# Patient Record
Sex: Female | Born: 1990 | Race: Black or African American | Hispanic: No | Marital: Married | Smoking: Never smoker
Health system: Southern US, Community
[De-identification: ages and names within clinical notes are randomized; demographics above are authoritative.]

## PROBLEM LIST (undated history)

## (undated) DIAGNOSIS — D649 Anemia, unspecified: Secondary | ICD-10-CM

## (undated) DIAGNOSIS — Z789 Other specified health status: Secondary | ICD-10-CM

## (undated) DIAGNOSIS — D219 Benign neoplasm of connective and other soft tissue, unspecified: Secondary | ICD-10-CM

## (undated) HISTORY — DX: Other specified health status: Z78.9

## (undated) HISTORY — PX: NO PAST SURGERIES: SHX2092

---

## 2020-01-13 ENCOUNTER — Inpatient Hospital Stay (HOSPITAL_COMMUNITY)
Admission: AD | Admit: 2020-01-13 | Discharge: 2020-01-13 | Disposition: A | Discharge status: Home / Self Care | Payer: Self-pay | Attending: Obstetrics and Gynecology | Admitting: Obstetrics and Gynecology

## 2020-01-13 ENCOUNTER — Other Ambulatory Visit: Payer: Self-pay

## 2020-01-13 DIAGNOSIS — Z3200 Encounter for pregnancy test, result unknown: Secondary | ICD-10-CM | POA: Insufficient documentation

## 2020-01-13 DIAGNOSIS — Z3201 Encounter for pregnancy test, result positive: Secondary | ICD-10-CM

## 2020-01-13 NOTE — MAU Note (Signed)
Presents for pregnancy confirmation.  States +HPT.  Denies any pain or other problems.

## 2020-01-13 NOTE — Discharge Instructions (Signed)
[place] for [employer] at [facility]    Phone: [place] for [employer] at [place]   Phone: [place] for [employer] at [place]  Phone: [place] for [employer] at [employer]  Phone: [place] for [employer] at [place]  Phone: [place] for [place] at [facility]   Phone: [facility]       Phone: [phone]  [facility]    Phone: [phone]   [facility]    Phone: [phone]  [facility]    Phone: [place]    Phone: [phone]  [place] [facility]       Phone: [phone]   [place] [facility]  Phone: [place]    Phone: [phone]  [facility]   Phone: [phone]  [facility]      Phone: [phone]  [facility]    Phone: [phone]

## 2020-02-04 ENCOUNTER — Ambulatory Visit (INDEPENDENT_AMBULATORY_CARE_PROVIDER_SITE_OTHER): Payer: Self-pay | Admitting: General Practice

## 2020-02-04 ENCOUNTER — Other Ambulatory Visit: Payer: Self-pay

## 2020-02-04 DIAGNOSIS — Z3201 Encounter for pregnancy test, result positive: Secondary | ICD-10-CM

## 2020-02-04 LAB — POCT PREGNANCY, URINE: Preg Test, Ur: POSITIVE — AB

## 2020-02-04 NOTE — Progress Notes (Signed)
Patient was assessed and managed by nursing staff during this encounter. I have reviewed the chart and agree with the documentation and plan. I have also made any necessary editorial changes.  Clarisa Fling, NP 02/04/2020 2:29 PM

## 2020-02-04 NOTE — Progress Notes (Signed)
Patient dropped off urine sample in office for UPT. UPT +.  Called patient and she reports first positive home test a month ago. This is her first pregnancy. LMP 11/08/19 [redacted]w[redacted]d EDD 08/14/20. Patient denies taking any meds/vitamins. Advised she begin taking PNV & that someone from our front office would contact her regarding scheduling her first prenatal appt. Patient verbalized understanding. I had to explain or rephrase myself multiple times to patient during phone call and it appears she doesn't completely understand English- may need interpreter at future appts.  Koren Bound RN BSN 02/04/20

## 2020-02-26 ENCOUNTER — Ambulatory Visit (INDEPENDENT_AMBULATORY_CARE_PROVIDER_SITE_OTHER): Payer: Self-pay

## 2020-02-26 ENCOUNTER — Other Ambulatory Visit (HOSPITAL_COMMUNITY)
Admission: RE | Admit: 2020-02-26 | Discharge: 2020-02-26 | Disposition: A | Discharge status: Home / Self Care | Payer: Self-pay | Source: Ambulatory Visit

## 2020-02-26 ENCOUNTER — Other Ambulatory Visit: Payer: Self-pay

## 2020-02-26 VITALS — BP 105/66 | HR 82 | Ht 64.0 in | Wt 126.3 lb

## 2020-02-26 DIAGNOSIS — Z789 Other specified health status: Secondary | ICD-10-CM

## 2020-02-26 DIAGNOSIS — Z3A15 15 weeks gestation of pregnancy: Secondary | ICD-10-CM

## 2020-02-26 DIAGNOSIS — N898 Other specified noninflammatory disorders of vagina: Secondary | ICD-10-CM

## 2020-02-26 DIAGNOSIS — Z3480 Encounter for supervision of other normal pregnancy, unspecified trimester: Secondary | ICD-10-CM | POA: Insufficient documentation

## 2020-02-26 DIAGNOSIS — Z3687 Encounter for antenatal screening for uncertain dates: Secondary | ICD-10-CM

## 2020-02-26 LAB — OB RESULTS CONSOLE GBS: GBS: POSITIVE

## 2020-02-26 MED ORDER — PREPLUS 27-1 MG PO TABS: 1.0000 | ORAL_TABLET | Freq: Every day | ORAL | 13 refills | Status: DC

## 2020-02-26 NOTE — Progress Notes (Signed)
Subjective:   Meagan Vance is a 29 y.o. G1P0 at [redacted]w[redacted]d by Unsure LMP being seen today for her first obstetrical visit.    Gynecological/Obstetrical History: Her obstetrical history is unremarkable. Patient does intend to breast feed. Pregnancy history fully reviewed. Patient reports no complaints.   Sexual Activity and Vaginal Concerns: Patient denies  bleeding or leaking, but reports a thin watery discharge. Patient reports she is uncomfortable with sexual activity, but has no pain or discomfort during the act.   Medical History/ROS: Denies significant medical history.  Denies issues with urination, constipation, diarrhea, or n/v.   Social History: Patient denies current usage of tobacco, alcohol, or drugs.  Patient reports the FOB is Adjune who is involved and supportive.  Patient reports that she lives with husband and roommate (a friend).  Patient endorses safety at home.  Patient denies DV/A. 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   HISTORY: OB History  Gravida Para Term Preterm AB Living  1 0 0 0 0 0  SAB TAB Ectopic Multiple Live Births  0 0 0 0 0    # Outcome Date GA Lbr Len/2nd Weight Sex Delivery Anes PTL Lv  1 Current             Last pap smear was deferred d/t self-pay status.   Past Medical History:  Diagnosis Date  . Medical history non-contributory    Past Surgical History:  Procedure Laterality Date  . NO PAST SURGERIES     Family History  Problem Relation Age of Onset  . Hypertension Maternal Uncle    Social History   Tobacco Use  . Smoking status: Never Smoker  . Smokeless tobacco: Never Used  Substance Use Topics  . Alcohol use: Yes    Comment: a little  . Drug use: Never   No Known Allergies No current outpatient medications on file prior to visit.   No current facility-administered medications on file prior to visit.    Review of Systems Pertinent items noted in HPI and remainder of comprehensive ROS otherwise  negative.  Exam   Vitals:   02/26/20 1450 02/26/20 1451  BP: 105/66   Pulse: 82   Weight: 126 lb 4.8 oz (57.3 kg)   Height:  5\' 4"  (1.626 m)   Fetal Heart Rate (bpm): 146  Physical Exam Constitutional:      Appearance: Normal appearance.  Genitourinary:     Vaginal discharge present.     No vaginal erythema or bleeding.     Cervical pinkness present.     No cervical motion tenderness, discharge, friability, erythema, bleeding, polyp or nabothian cyst.     Uterus is enlarged.     Genitourinary Comments: External genitalia appears inflamed with erythema.  Moderate amt thin gray discharge noted at introitus and between labial folds.  Vault with moderate amt thick white discharge and questionable curd-type consistency.  CV collected.  HENT:     Head: Normocephalic and atraumatic.  Eyes:     Conjunctiva/sclera: Conjunctivae normal.  Cardiovascular:     Rate and Rhythm: Normal rate and regular rhythm.     Heart sounds: Normal heart sounds.  Pulmonary:     Effort: Pulmonary effort is normal. No respiratory distress.     Breath sounds: Normal breath sounds.  Chest:     Breasts:        Right: No mass, nipple discharge, skin change or tenderness.        Left: No mass, nipple discharge,  skin change or tenderness.     Comments: CBE Completed Abdominal:     Palpations: Abdomen is soft.     Tenderness: There is no abdominal tenderness.     Comments: Gravid--Soft, NT, FH at 22cm   Lymphadenopathy:     Upper Body:     Right upper body: No axillary adenopathy.     Left upper body: No axillary adenopathy.  Neurological:     Mental Status: She is alert and oriented to person, place, and time.  Psychiatric:        Mood and Affect: Mood normal.        Thought Content: Thought content normal.        Judgment: Judgment normal.     Assessment:   29 y.o. year old G1P0 Patient Active Problem List   Diagnosis Date Noted  . Supervision of other normal pregnancy, antepartum  02/26/2020  . Language barrier 02/26/2020     Plan:  1. Supervision of other normal pregnancy, antepartum -Congratulations given and patient welcomed to practice. -Discussed usage of Babyscripts and virtual visits as additional source of managing and completing PN visits in midst of coronavirus.   *Instructed to take blood pressure and record weekly into babyscripts. *Reviewed modified prenatal visit schedule and platforms used for virtual visits.  -Anticipatory guidance for prenatal visits including labs, ultrasounds, and testing; Initial labs drawn. -Genetic Screening discussed, First trimester screen, Quad screen and NIPS: Not ordered d/t Self Pay Status. -Encouraged to complete MyChart Registration for her ability to review results, send requests, and have questions addressed.  -Discussed estimated due date of August 13, 2020, but how it may change depending on [place]. -Ultrasound discussed; fetal anatomic survey: ordered. -Initiate prenatal vitamins; Rx sent to pharmacy on file and printed script given.   -Encouraged to seek out care at office or emergency room for urgent and/or emergent concerns. -Educated on the nature of [place] with multiple MDs and other Advanced Practice Providers was explained to patient; also emphasized that residents, students are part of our team. Informed of her right to refuse care as she deems appropriate.  -No questions or concerns.   2. Language barrier -[place] speaking -Interpreter present and assists  3. Size greater than dates -Will order dating [place].   4. Vaginal Discharge -CV collected  Problem list reviewed and updated. Routine obstetric precautions reviewed.  Orders Placed This Encounter  Procedures  . Culture, OB Urine  . [place] MFM OB COMP + 14 WK    Standing Status:   Future    Standing Expiration Date:   04/27/2020    Order Specific Question:   Reason for Exam (SYMPTOM  OR DIAGNOSIS REQUIRED)     Answer:   Anatomy    Order Specific Question:   Preferred Location    Answer:   [facility]  . CBC/D/Plt+RPR+Rh+ABO+Rub Ab...  . Hemoglobin A1c    Return in about 4 weeks (around 03/25/2020) for LROB.     Maryann Conners, CNM 02/26/2020 3:07 PM

## 2020-02-26 NOTE — Patient Instructions (Signed)
AREA PEDIATRIC/FAMILY PRACTICE PHYSICIANS  [place] ([address]) . [facility] o Chambliss, MD; Eniola, MD; Hale, MD; Hensel, MD; McDiarmid, MD; McIntyer, MD; Emmerich Cryer, MD; Walden, MD o [address] [place], [place] [address] o [phone] o Mon-Fri 8:30-12:30, 1:30-5:00 o Providers come to see babies at [facility] o Accepting Medicaid . [facility] o Limited providers who accept newborns: Koirala, MD; Morrow, MD; Wolters, MD o [address], [place], [place] [address] o [phone] o Mon-Fri 8:00-5:30 o Babies seen by providers at [facility] o Does NOT accept Medicaid o Please call early in hospitalization for appointment (limited availability)  . [facility] o Mulberry, MD o [address] [place], [place] [address] o [phone] o Mon, Tue, Thur, Fri 8:30-5:00, Wed 10:00-7:00 (closed 1-2pm) o Babies seen by [facility] providers o Accepting Medicaid . [facility] o Rubin, MD o [address], [place], [place] [address] o [phone] o Mon-Fri 8:30-5:00, Sat 8:30-12:00 o Provider comes to see babies at [facility] o Accepting Medicaid o Must have been referred from current patients or contacted office prior to delivery . [facility] ([facility]) o Brown, MD; Chandler, MD; Ettefagh, MD; Grant, MD; Lester, MD; McCormick, MD; McQueen, MD; Prose, MD; Simha, MD; Stanley, MD; Stryffeler, NP; Tebben, NP o [address], [place], [place] [address] o [phone] o Mon, Tue, Thur, Fri 8:30-5:30, Wed 9:30-5:30, Sat 8:30-12:30 o Babies seen by [facility] providers o Accepting Medicaid o Only accepting infants of first-time parents or siblings of current patients o Hospital discharge coordinator will make follow-up appointment . Jack Amos o [address],  [place], [place]  [address] o [phone]   Fax - [phone] . [facility] o [address], [place], [place]  [address] o Phone - [phone]   Fax - [phone] . Shilpa Gosrani o [address], [place], [place]  [address] o [phone]  [place] ([address]) . [facility] o Bates, MD; [place], MD; Cooper, Cox, MD; MD; Davis, MD; Dovico, MD; Ettefaugh, MD; Little, MD; Lowe, MD; Keiffer, MD; Melvin, MD; Sumner, MD; Williams, MD o [address], [place], [place] [address] o [phone] o Mon-Fri 8:30-5:00 (extended evenings Mon-Thur as needed), Sat-Sun 10:00-1:00 o Providers come to see babies at [facility] o Accepting Medicaid for families of first-time babies and families with all children in the household age 3 and under. Must register with office prior to making appointment (M-F only). . [facility] o Henson, NP; Knapp, MD; Lalonde, MD; Tysinger, PA o [address] [place], [place] [address] o [phone] o Mon-Fri 8:00-5:00 o Babies seen by providers at [facility] o Does NOT accept Medicaid/Commercial Insurance Only . [facility] [place] ([facility]  o Artis, MD; [place], MD; Bratton, MD; Coccaro, MD; Lockett Gardner, MD; Kramer, MD; Marshall, MD; Netherton, MD; Poleto, MD; Skinner, MD o [address] [place], [place] [address] o [phone] o Mon-Fri 8:30-5:30, Sat (Oct.-Mar.) 9:00-1:00 o Babies seen by providers at [facility] o Accepting Medicaid  [place] ([address]) . [facility] o Reid, MD; Warner, MD o [address], [place], [place] [address] o [phone] o Mon-Fri 8:30-5:00, Sat 8:30-12:00 o Providers come to see babies at [facility] o Does NOT accept Medicaid . [facility] o Becker, PA; Hagler, MD; Scifres, PA; Sun, MD; Swayne, MD o [address],  [place], [place] [address] o [phone] o Mon-Fri 8:00-5:00 o Babies seen by providers at [facility] o Does NOT accept Medicaid o Only accepting babies of parents who are patients o Please call early in hospitalization for appointment (limited availability) . [facility] o Clark, MD; Frye, MD; Kelleher, MD; Mack, NP; Miller, MD; O'Keller, MD; Patterson, NP; Pudlo, MD; Puzio, MD; Thomas, MD; Tucker, MD; Twiselton, MD o [address], [place], [place] [address] o [phone] o Mon-Fri 8:00-5:00, Sat 9:00-12:00 o Providers come to see babies at [facility] o Does NOT accept Medicaid  [place] ([address]) . [facility] o Limited providers accepting new patients: Brake, NP; Wharton, PA o [address], [place], [place] [address] o [phone] o Mon-Fri 8:00-5:00 o Babies seen by providers at [facility] o Does NOT accept Medicaid o Only accepting babies of parents who are patients o Please call early in hospitalization for appointment (limited availability) . [facility] o Gay, MD; Quinlan, MD o [address] [place], [place] [address] o [phone] (press 1 to schedule appointment) o Mon-Fri 8:00-5:00 o Providers come to see babies at [facility] o Does NOT accept Medicaid . [facility] o Mazer, MD o [address] [place], [place] [address] o [phone] o Mon-Fri 8:30-5:00 (lunch 12:30-1:00), extended hours by appointment only Wed 5:00-6:30 o Babies seen by [facility] providers o Accepting Medicaid . [facility] o Banks, MD; Jordan, MD; Koberlein, MD o [address], [place], [place] [address] o [phone] o Mon-Fri 8:00-5:00 o Babies seen by [facility] providers o Does NOT accept Medicaid . [facility] o Parker, MD; Hunter, MD; Wallace, DO o [address] [place], [place]  [address] o [phone] o Mon-Fri 8:00-5:00 o Babies seen by [facility] providers o Does NOT accept Medicaid . [facility] o Brandon, PA; Brecken, PA; Christy, NP; Dees, MD; DeClaire, MD; DeWeese, MD; Hansen, NP; Mills, NP; Parrish, NP; Smoot, NP; Summer, MD; Vapne, MD o [address] [place], [place] [address] o [phone] o Mon-Fri 8:30-5:00, Sat 10:00-1:00 o Providers come to see babies at [facility] o Does NOT accept Medicaid o Free prenatal information session Tuesdays at 4:45pm . [facility] o Bouska, MD; Gordon, PA; Jeffery, PA; Weber, PA o [address] [place] [address] o [phone] o Mon-Fri 7:30-5:30 o Babies seen by [facility] providers . [facility] o [address], [place], [place]  [address] o [phone]   Fax - [phone]  [place] ([address] & [address]) . [facility] o [place]ese, MD o [address] [place], [place] [address] o [phone] o Mon-Thur 8:00-6:00 o Providers come to see babies at [facility] o Accepting Medicaid . [facility] o Anderson, NP; Badger, MD; Beal, PA; Spencer, PA o [address] [place], [place] [address] o [phone] o Mon-Thur 7:30-7:30, Fri 7:30-4:30 o Babies seen by [facility] providers o Accepting Medicaid . [facility] o Agbuya, MD; Klett, NP; Romgoolam, MD o [address], [place], [place] [address] o [phone] o Mon-Fri 8:30-5:00, Sat 8:30-12:00 o Providers come to see babies at [facility] o Accepting Medicaid o Must have "Meet & Greet" appointment at office prior to delivery . [facility]  [facility] o McCord,   MD; Wallace, MD; Wood, MD o [address], [place], [place] [address] o [phone] o Mon-Wed 8:00-6:00, Thur-Fri 8:00-5:00, Sat 9:00-12:00 o Providers come to  see babies at [facility] o Does NOT accept Medicaid o Only accepting siblings of current patients . [facility]  o [address], [place], [place]  [address] o [phone]   Fax - [phone] . [facility] o [address], [place], [place]  [address] o [phone]   Fax - [phone]  [place] ([address] & [address]) . [facility] o Cirigliano, DO; Matthews, DO o [address] [place], [place] [address] o [phone] o Mon-Fri 7:00-5:00 o Babies seen by [facility] providers o Does NOT accept Medicaid . [facility] o Briscoe, MD; Howley, PA; Moreira, PA o [address], [place], [place] [address] o [phone] o Mon-Fri 8:00-5:00 o Babies seen by [facility] providers o Accepting Medicaid . [facility] o Boyd, MD; Church, PA; Jones, NP; Osborn, PA o [address], [place], [place] [address] o [phone] o Mon-Fri 8:00-5:00 o Babies seen by providers at [facility] o Accepting Medicaid  [place] ([address]) . [facility] [place] o Wendling, DO o [address] [place], [place] [address] o [phone] o Mon-Fri 8:00-5:00 o Babies seen by [facility] providers o Does NOT accept Medicaid o Limited availability, please call early in hospitalization to schedule follow-up . [facility] o Calderon, PA; Cummings, MD; Dillard, MD; Martin, PA; Olson, MD; VanDeven, PA o [address], [place], [place] [address] o [phone] o Mon-Fri 8:30-5:00, Sat 9:00-12:00 o Babies seen by providers at [facility] o Accepting Medicaid o Please register online then schedule online or call office o [email or web address] . [facility] o Hunter, NP; Kumar, MD; Martin Rogers, PA o [address], [place], [place] [address] o [phone] o Mon-Fri 8:00-5:00 o Babies seen by providers at [facility] o Accepting Medicaid . [facility] ([facility] o Acampo, MD; Kristi Fleenor, NP; West, MD o [address], [place], [place] [address] o [phone] o Mon-Fri 8:00-5:30, Sat&Sun by appointment (phones open at 8:30) o Babies seen by [facility] providers o Accepting Medicaid o Must be a first-time baby or sibling of current patient . [facility] [place]  o [address], [place], [place]  [address] o [phone]   Fax - [phone]  [place] ([address]) . [facility] o Brown, PA; Cowen, PA; Rice, MD; Helton, PA; Spry, MD o [address] [place], [place] [address] o [phone] o Mon-Thur 8:00-7:00, Fri 8:00-5:00, Sat 8:00-12:00, Sun 9:00-12:00 o Babies seen by [facility] providers o Accepting Medicaid . [facility] [place] o Coe-Goins, MD; Marshall, MD; Pierre-Louis, MD o [address] [place] [address], [place],  [address] o [phone] o Mon-Thur 8:00-5:00 o Babies seen by providers at [facility] o Accepting Medicaid . [facility] o Bratton, MD; Coe-Goins, MD; Hayes, MD; Lewis, MD; List, MD; Lott, MD; Marshall, MD; Moran, MD; O'Ahuva Poynor, MD; Pierre-Louis, MD; Pitonzo, MD; Scholer, MD; Spangle, MD o [address] [place],    [address] o [phone] o Mon-Fri 8:00-5:30, Sat (Oct.-Mar.) 9:00-1:00 o Babies seen by providers at [facility] o Accepting Medicaid o Must fill out new patient packet, available online at [email or web address] . [facility] ([facility] o Friddle, NP; Harris, NP; Kelly, NP; Logan, MD;  Melvin, PA; Poth, MD; Ramadoss, MD; Stanton, NP o [address], [place], [place] [address] o [phone] o Mon-Thur 8:00-5:30, Fri 8:00-5:00 o Babies seen by providers at [facility] o Accepting Medicaid  [place] ([address]) . [facility] o Dixon, PA; Catahoula, MD; Pickard, MD; Tapia, PA o [address], [place], [place] [address] o [phone] o Mon-Fri 8:00-5:00 o Babies seen by providers at [facility] o Accepting Medicaid   [place] ([address]) . [facility] [place] o Masneri, DO; Meyers, MD; Nelson, PA o [address], [place], [place] [address] o [phone] o Mon-Fri 8:00-5:00 o Babies seen by providers at [facility] o Does NOT accept Medicaid o Limited appointment availability, please call early in hospitalization  . [facility] [place] o Kunedd, DO; McGowen, MD o [address], [place], [place] [address] o [phone] o Mon-Fri 8:00-5:00 o Babies seen by [facility] providers o Does NOT accept Medicaid . [facility] [place] o Cameron, MD; MacDonald, MD; Michaels, PA; Nayak, MD o [address] [place] [address], [place], [place] [address] o [phone] o Mon-Fri 8:00-5:00 o After hours clinic ([address] [place], [place] [address]) [phone] Mon-Fri 5:00-8:00, Sat 12:00-6:00, Sun 10:00-4:00 o Babies seen by [facility] providers o Accepting Medicaid . [facility] [place] o [address], [place], [place]  [address] o [phone]   Fax - [phone]  [place] ([address]) . [facility] [place] o Andy, MD o [address], [place], [place] [address] o [phone] o Mon-Fri 8:00-5:00 o Babies seen by [facility] providers o Does NOT accept Medicaid . [facility] [place] ([facility] [place]) o Eksir, MD o [address], [place], [place]  [address] o [phone] o Mon-Thur 8:00-7:00, Fri 8:00-5:00, Sat 8:00-12:00 o Babies seen by providers at [facility] o Accepting Medicaid - but does not have vaccinations in office (must be received elsewhere) o Limited availability, please call early in hospitalization  [place] ([address]) . [facility]  o Charlene Flemming, MD o [address], [place]  [address] o [phone]  Fax [phone]   

## 2020-02-27 LAB — CBC/D/PLT+RPR+RH+ABO+RUB AB...
Basophils Absolute: 0 10*3/uL (ref 0.0–0.2)
Basos: 0 %
EOS (ABSOLUTE): 0 10*3/uL (ref 0.0–0.4)
Eos: 1 %
HCV Ab: <0.1 s/co ratio (ref 0.0–0.9)
HIV Screen 4th Generation wRfx: NONREACTIVE
Hematocrit: 30 % — ABNORMAL LOW (ref 34.0–46.6)
Hemoglobin: 9.7 g/dL — ABNORMAL LOW (ref 11.1–15.9)
Hepatitis B Surface Ag: NEGATIVE
Immature Grans (Abs): 0 10*3/uL (ref 0.0–0.1)
Immature Granulocytes: 0 %
Lymphocytes Absolute: 2 10*3/uL (ref 0.7–3.1)
Lymphs: 28 %
MCH: 25.9 pg — ABNORMAL LOW (ref 26.6–33.0)
MCHC: 32.3 g/dL (ref 31.5–35.7)
MCV: 80 fL (ref 79–97)
Monocytes Absolute: 0.5 10*3/uL (ref 0.1–0.9)
Monocytes: 7 %
Neutrophils Absolute: 4.6 10*3/uL (ref 1.4–7.0)
Neutrophils: 64 %
Platelets: 365 10*3/uL (ref 150–450)
RBC: 3.75 x10E6/uL — ABNORMAL LOW (ref 3.77–5.28)
RDW: 13.8 % (ref 11.7–15.4)
RPR Ser Ql: NONREACTIVE
Rh Factor: POSITIVE
Rubella Antibodies, IGG: >33 index (ref >0.99)
WBC: 7.2 10*3/uL (ref 3.4–10.8)

## 2020-02-27 LAB — CERVICOVAGINAL ANCILLARY ONLY
Bacterial Vaginitis (gardnerella): NEGATIVE
Candida Glabrata: NEGATIVE
Candida Vaginitis: POSITIVE — AB
Chlamydia: NEGATIVE
Comment: NEGATIVE
Comment: NEGATIVE
Comment: NEGATIVE
Comment: NEGATIVE
Comment: NEGATIVE
Comment: NORMAL
Neisseria Gonorrhea: NEGATIVE
Trichomonas: NEGATIVE

## 2020-02-27 LAB — AB SCR+ANTIBODY ID: Antibody Screen: POSITIVE — AB

## 2020-02-27 LAB — HEMOGLOBIN A1C
Est. average glucose Bld gHb Est-mCnc: 111 mg/dL
Hgb A1c MFr Bld: 5.5 % (ref 4.8–5.6)

## 2020-02-27 LAB — HCV INTERPRETATION

## 2020-02-27 MED ORDER — TERCONAZOLE 0.4 % VA CREA
1.0000 | TOPICAL_CREAM | Freq: Every day | VAGINAL | 0 refills | Status: DC
Start: 2020-02-27 — End: 2020-03-30

## 2020-02-27 MED ORDER — FERROUS SULFATE 325 (65 FE) MG PO TABS: 325.0000 mg | ORAL_TABLET | ORAL | 1 refills | Status: DC

## 2020-02-27 NOTE — Addendum Note (Signed)
Addended by: [name] L on: 02/27/2020 09:29 PM   Modules accepted: Orders

## 2020-03-01 ENCOUNTER — Telehealth (INDEPENDENT_AMBULATORY_CARE_PROVIDER_SITE_OTHER): Payer: Self-pay

## 2020-03-01 ENCOUNTER — Other Ambulatory Visit: Payer: Self-pay | Admitting: Nurse Practitioner

## 2020-03-01 ENCOUNTER — Encounter: Payer: Self-pay | Admitting: Nurse Practitioner

## 2020-03-01 DIAGNOSIS — O2340 Unspecified infection of urinary tract in pregnancy, unspecified trimester: Secondary | ICD-10-CM | POA: Insufficient documentation

## 2020-03-01 DIAGNOSIS — Z3480 Encounter for supervision of other normal pregnancy, unspecified trimester: Secondary | ICD-10-CM

## 2020-03-01 DIAGNOSIS — B951 Streptococcus, group B, as the cause of diseases classified elsewhere: Secondary | ICD-10-CM | POA: Insufficient documentation

## 2020-03-01 LAB — CULTURE, OB URINE

## 2020-03-01 LAB — URINE CULTURE, OB REFLEX

## 2020-03-01 MED ORDER — AMOXICILLIN 500 MG PO TABS
500.0000 mg | ORAL_TABLET | Freq: Three times a day (TID) | ORAL | 0 refills | Status: AC
Start: 2020-03-01 — End: 2020-03-08

## 2020-03-01 NOTE — Telephone Encounter (Addendum)
-----   Message from [name], [place] sent at 02/27/2020  9:29 PM EDT ----- Please call patient and inform of yeast infection and low iron. Patient is to take iron supplement every other day. I have sent prescriptions to patient's pharmacy, but patient may require paper scripts to find more inexpensive options. Thanks, Berle Mull, Rona Ravens, NP  P [facility] GBS UTI - medication ordered. Please call client and notify her and advise to pick up medication.    Called pt with [place] # [phone] and L/M that I am calling with results if she could please give the office a call back.    Mel Almond, RN  03/01/20

## 2020-03-03 ENCOUNTER — Ambulatory Visit
Admission: RE | Admit: 2020-03-03 | Discharge: 2020-03-03 | Disposition: A | Discharge status: Home / Self Care | Payer: Self-pay | Source: Ambulatory Visit

## 2020-03-03 ENCOUNTER — Other Ambulatory Visit: Payer: Self-pay

## 2020-03-03 ENCOUNTER — Ambulatory Visit (HOSPITAL_BASED_OUTPATIENT_CLINIC_OR_DEPARTMENT_OTHER): Payer: Self-pay

## 2020-03-03 DIAGNOSIS — D259 Leiomyoma of uterus, unspecified: Secondary | ICD-10-CM

## 2020-03-03 DIAGNOSIS — Z3480 Encounter for supervision of other normal pregnancy, unspecified trimester: Secondary | ICD-10-CM | POA: Insufficient documentation

## 2020-03-03 DIAGNOSIS — Z363 Encounter for antenatal screening for malformations: Secondary | ICD-10-CM

## 2020-03-03 DIAGNOSIS — O26842 Uterine size-date discrepancy, second trimester: Secondary | ICD-10-CM

## 2020-03-03 DIAGNOSIS — Z3A16 16 weeks gestation of pregnancy: Secondary | ICD-10-CM

## 2020-03-03 DIAGNOSIS — O3412 Maternal care for benign tumor of corpus uteri, second trimester: Secondary | ICD-10-CM

## 2020-03-04 ENCOUNTER — Encounter: Payer: Self-pay | Admitting: Nurse Practitioner

## 2020-03-04 ENCOUNTER — Other Ambulatory Visit: Payer: Self-pay

## 2020-03-04 DIAGNOSIS — D219 Benign neoplasm of connective and other soft tissue, unspecified: Secondary | ICD-10-CM | POA: Insufficient documentation

## 2020-03-04 DIAGNOSIS — D259 Leiomyoma of uterus, unspecified: Secondary | ICD-10-CM | POA: Insufficient documentation

## 2020-03-04 NOTE — Telephone Encounter (Signed)
Called patient with assistance of [place] Telephone interpreter, Vicente Males # [phone].   Informed patient that she has a UTI and needs to take Antibiotics and need to start ATB ASAP. Informed her that she has a yeast infection and Terazole has been prescribed to take at night for 7 nights. Reviewed can take along with ATB.   Informed her she has low iron and that Iron pills have been prescribed to take every other day with [facility].   Patient voiced understanding to all the above with no further questions or concerns at this time.

## 2020-03-23 ENCOUNTER — Other Ambulatory Visit: Payer: Self-pay

## 2020-03-23 ENCOUNTER — Other Ambulatory Visit: Payer: Self-pay | Admitting: *Deleted

## 2020-03-23 ENCOUNTER — Ambulatory Visit: Payer: Medicaid Other

## 2020-03-23 DIAGNOSIS — O35EXX0 Maternal care for other (suspected) fetal abnormality and damage, fetal genitourinary anomalies, not applicable or unspecified: Secondary | ICD-10-CM

## 2020-03-23 DIAGNOSIS — Z363 Encounter for antenatal screening for malformations: Secondary | ICD-10-CM

## 2020-03-23 DIAGNOSIS — Z3480 Encounter for supervision of other normal pregnancy, unspecified trimester: Secondary | ICD-10-CM | POA: Insufficient documentation

## 2020-03-23 DIAGNOSIS — O359XX0 Maternal care for (suspected) fetal abnormality and damage, unspecified, not applicable or unspecified: Secondary | ICD-10-CM

## 2020-03-23 DIAGNOSIS — O3412 Maternal care for benign tumor of corpus uteri, second trimester: Secondary | ICD-10-CM

## 2020-03-23 DIAGNOSIS — Z3A19 19 weeks gestation of pregnancy: Secondary | ICD-10-CM

## 2020-03-23 DIAGNOSIS — O26842 Uterine size-date discrepancy, second trimester: Secondary | ICD-10-CM

## 2020-03-23 DIAGNOSIS — D259 Leiomyoma of uterus, unspecified: Secondary | ICD-10-CM

## 2020-03-30 ENCOUNTER — Encounter: Payer: Self-pay | Admitting: General Practice

## 2020-03-30 ENCOUNTER — Ambulatory Visit (INDEPENDENT_AMBULATORY_CARE_PROVIDER_SITE_OTHER): Payer: Medicaid Other | Admitting: Certified Nurse Midwife

## 2020-03-30 ENCOUNTER — Other Ambulatory Visit: Payer: Self-pay

## 2020-03-30 ENCOUNTER — Encounter: Payer: Self-pay | Admitting: Certified Nurse Midwife

## 2020-03-30 VITALS — BP 105/67 | HR 93 | Wt 130.3 lb

## 2020-03-30 DIAGNOSIS — Z3480 Encounter for supervision of other normal pregnancy, unspecified trimester: Secondary | ICD-10-CM

## 2020-03-30 DIAGNOSIS — B951 Streptococcus, group B, as the cause of diseases classified elsewhere: Secondary | ICD-10-CM

## 2020-03-30 DIAGNOSIS — Z789 Other specified health status: Secondary | ICD-10-CM

## 2020-03-30 DIAGNOSIS — O2342 Unspecified infection of urinary tract in pregnancy, second trimester: Secondary | ICD-10-CM

## 2020-03-30 LAB — POCT URINALYSIS DIP (DEVICE)
Bilirubin Urine: NEGATIVE
Glucose, UA: NEGATIVE mg/dL
Hgb urine dipstick: NEGATIVE
Ketones, ur: NEGATIVE mg/dL
Leukocytes,Ua: NEGATIVE
Nitrite: NEGATIVE
Protein, ur: NEGATIVE mg/dL
Specific Gravity, Urine: 1.01 (ref 1.005–1.030)
Urobilinogen, UA: 0.2 mg/dL (ref 0.0–1.0)
pH: 6 (ref 5.0–8.0)

## 2020-03-30 NOTE — Progress Notes (Signed)
   PRENATAL VISIT NOTE  Subjective:  Meagan Vance is a 29 y.o. G1P0 at [redacted]w[redacted]d being seen today for ongoing prenatal care.  She is currently monitored for the following issues for this low-risk pregnancy and has Supervision of other normal pregnancy, antepartum; Language barrier; GBS (group B streptococcus) UTI complicating pregnancy; and Uterine fibroids affecting pregnancy, antepartum on their problem list.  Patient reports no complaints.  Contractions: Not present. Vag. Bleeding: None.  Movement: Absent. Denies leaking of fluid.   The following portions of the patient's history were reviewed and updated as appropriate: allergies, current medications, past family history, past medical history, past social history, past surgical history and problem list.   Objective:   Vitals:   03/30/20 1017  BP: 105/67  Pulse: 93  Weight: 130 lb 4.8 oz (59.1 kg)    Fetal Status: Fetal Heart Rate (bpm): 140   Movement: Absent     General:  Alert, oriented and cooperative. Patient is in no acute distress.  Skin: Skin is warm and dry. No rash noted.   Cardiovascular: Normal heart rate noted  Respiratory: Normal respiratory effort, no problems with respiration noted  Abdomen: Soft, gravid, appropriate for gestational age.  Pain/Pressure: Present     Pelvic: Cervical exam deferred        Extremities: Normal range of motion.  Edema: None  Mental Status: Normal mood and affect. Normal behavior. Normal judgment and thought content.   Assessment and Plan:  Pregnancy: G1P0 at [redacted]w[redacted]d 1. Supervision of other normal pregnancy, antepartum - patient doing well, no complaints - Patient has medicaid and would like to do genetic screening at this time  - Routine prenatal care - Anticipatory guidance on upcoming appointments  - AFP, Serum, Open Spina Bifida - Genetic Screening  2. Group B Streptococcus urinary tract infection affecting pregnancy in second trimester - TOC completed today  - Patient will  need tx during labor  - Culture, OB Urine  3. Language barrier - [place] interpreter at bedside   Preterm labor symptoms and general obstetric precautions including but not limited to vaginal bleeding, contractions, leaking of fluid and fetal movement were reviewed in detail with the patient. Please refer to After Visit Summary for other counseling recommendations.   Return in about 4 weeks (around 04/27/2020) for ROB-in person .  Future Appointments  Date Time Provider [place]  05/03/2020  1:15 PM Herby Abraham [facility]  05/18/2020  1:00 PM [facility] NURSE [facility]  05/18/2020  1:15 PM [facility] US2 [facility] [place]    Lajean Manes, CNM

## 2020-03-30 NOTE — Patient Instructions (Signed)

## 2020-04-01 LAB — AFP, SERUM, OPEN SPINA BIFIDA
AFP MoM: 1.15
AFP Value: 77.2 ng/mL
Gest. Age on Collection Date: 20.4 weeks
Maternal Age At EDD: 27.7 yr
OSBR Risk 1 IN: 7603
Test Results:: NEGATIVE
Weight: 130 [lb_av]

## 2020-04-01 LAB — URINE CULTURE, OB REFLEX: Organism ID, Bacteria: NO GROWTH

## 2020-04-01 LAB — CULTURE, OB URINE

## 2020-04-12 ENCOUNTER — Encounter: Payer: Self-pay | Admitting: *Deleted

## 2020-04-22 ENCOUNTER — Telehealth (INDEPENDENT_AMBULATORY_CARE_PROVIDER_SITE_OTHER): Payer: Medicaid Other | Admitting: Lactation Services

## 2020-04-22 DIAGNOSIS — Z3480 Encounter for supervision of other normal pregnancy, unspecified trimester: Secondary | ICD-10-CM

## 2020-04-22 NOTE — Telephone Encounter (Signed)
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  Informed patient that she is a silent carrier for Alpha Thalassemia and carrier for Tay-Sachs Disease.   Recommended that patient call [facility] at [phone] to schedule a Telephone Genetic Counseling Session. Discussed it will be recommended that FOB also be tested for the same genes and that Johnsie Cancel will discuss how to ger FOB tested.   Patient with no questions or concerns.

## 2020-05-03 ENCOUNTER — Encounter: Payer: Self-pay | Admitting: Certified Nurse Midwife

## 2020-05-03 ENCOUNTER — Ambulatory Visit (INDEPENDENT_AMBULATORY_CARE_PROVIDER_SITE_OTHER): Payer: Medicaid Other | Admitting: Certified Nurse Midwife

## 2020-05-03 ENCOUNTER — Other Ambulatory Visit: Payer: Self-pay

## 2020-05-03 VITALS — BP 112/60 | HR 112 | Wt 136.2 lb

## 2020-05-03 DIAGNOSIS — Z3A25 25 weeks gestation of pregnancy: Secondary | ICD-10-CM

## 2020-05-03 DIAGNOSIS — Z3480 Encounter for supervision of other normal pregnancy, unspecified trimester: Secondary | ICD-10-CM

## 2020-05-03 DIAGNOSIS — R8271 Bacteriuria: Secondary | ICD-10-CM

## 2020-05-03 DIAGNOSIS — Z789 Other specified health status: Secondary | ICD-10-CM

## 2020-05-03 NOTE — Patient Instructions (Signed)
Glucose Tolerance Test During Pregnancy Why am I having this test? The glucose tolerance test (GTT) is done to check how your body processes sugar (glucose). This is one of several tests used to diagnose diabetes that develops during pregnancy (gestational diabetes mellitus). Gestational diabetes is a temporary form of diabetes that some women develop during pregnancy. It usually occurs during the second trimester of pregnancy and goes away after delivery. Testing (screening) for gestational diabetes usually occurs between 24 and 28 weeks of pregnancy. You may have the GTT test after having a 1-hour glucose screening test if the results from that test indicate that you may have gestational diabetes. You may also have this test if:  You have a history of gestational diabetes.  You have a history of giving birth to very large babies or have experienced repeated fetal loss (stillbirth).  You have signs and symptoms of diabetes, such as: ? Changes in your vision. ? Tingling or numbness in your hands or feet. ? Changes in hunger, thirst, and urination that are not otherwise explained by your pregnancy. What is being tested? This test measures the amount of glucose in your blood at different times during a period of 3 hours. This indicates how well your body is able to process glucose. What kind of sample is taken?  Blood samples are required for this test. They are usually collected by inserting a needle into a blood vessel. How do I prepare for this test?  For 3 days before your test, eat normally. Have plenty of carbohydrate-rich foods.  Follow instructions from your health care provider about: ? Eating or drinking restrictions on the day of the test. You may be asked to not eat or drink anything other than water (fast) starting 8-10 hours before the test. ? Changing or stopping your regular medicines. Some medicines may interfere with this test. Tell a health care provider about:  All  medicines you are taking, including vitamins, herbs, eye drops, creams, and over-the-counter medicines.  Any blood disorders you have.  Any surgeries you have had.  Any medical conditions you have. What happens during the test? First, your blood glucose will be measured. This is referred to as your fasting blood glucose, since you fasted before the test. Then, you will drink a glucose solution that contains a certain amount of glucose. Your blood glucose will be measured again 1, 2, and 3 hours after drinking the solution. This test takes about 3 hours to complete. You will need to stay at the testing location during this time. During the testing period:  Do not eat or drink anything other than the glucose solution.  Do not exercise.  Do not use any products that contain nicotine or tobacco, such as cigarettes and e-cigarettes. If you need help stopping, ask your health care provider. The testing procedure may vary among health care providers and hospitals. How are the results reported? Your results will be reported as milligrams of glucose per deciliter of blood (mg/dL) or millimoles per liter (mmol/L). Your health care provider will compare your results to normal ranges that were established after testing a large group of people (reference ranges). Reference ranges may vary among labs and hospitals. For this test, common reference ranges are:  Fasting: less than 95-105 mg/dL (5.3-5.8 mmol/L).  1 hour after drinking glucose: less than 180-190 mg/dL (10.0-10.5 mmol/L).  2 hours after drinking glucose: less than 155-165 mg/dL (8.6-9.2 mmol/L).  3 hours after drinking glucose: 140-145 mg/dL (7.8-8.1 mmol/L). What do the   results mean? Results within reference ranges are considered normal, meaning that your glucose levels are well-controlled. If two or more of your blood glucose levels are high, you may be diagnosed with gestational diabetes. If only one level is high, your health care  provider may suggest repeat testing or other tests to confirm a diagnosis. Talk with your health care provider about what your results mean. Questions to ask your health care provider Ask your health care provider, or the department that is doing the test:  When will my results be ready?  How will I get my results?  What are my treatment options?  What other tests do I need?  What are my next steps? Summary  The glucose tolerance test (GTT) is one of several tests used to diagnose diabetes that develops during pregnancy (gestational diabetes mellitus). Gestational diabetes is a temporary form of diabetes that some women develop during pregnancy.  You may have the GTT test after having a 1-hour glucose screening test if the results from that test indicate that you may have gestational diabetes. You may also have this test if you have any symptoms or risk factors for gestational diabetes.  Talk with your health care provider about what your results mean. This information is not intended to replace advice given to you by your health care provider. Make sure you discuss any questions you have with your health care provider. Document Revised: 10/24/2018 Document Reviewed: 02/12/2017 Elsevier Patient Education  [place].

## 2020-05-03 NOTE — Progress Notes (Signed)
° °  PRENATAL VISIT NOTE  Subjective:  Meagan Vance is a 29 y.o. G1P0 at [redacted]w[redacted]d being seen today for ongoing prenatal care.  She is currently monitored for the following issues for this low-risk pregnancy and has Supervision of other normal pregnancy, antepartum; Language barrier; GBS (group B streptococcus) UTI complicating pregnancy; and Uterine fibroids affecting pregnancy, antepartum on their problem list.  Patient reports no complaints other than mild cramping in her upper thigh in the mornings.  Contractions: Not present. Vag. Bleeding: None.  Movement: Present. Denies leaking of fluid.   The following portions of the patient's history were reviewed and updated as appropriate: allergies, current medications, past family history, past medical history, past social history, past surgical history and problem list.   Objective:   Vitals:   05/03/20 1326  BP: 112/60  Pulse: (!) 112  Weight: 136 lb 3.2 oz (61.8 kg)    Fetal Status: Fetal Heart Rate (bpm): 154 Fundal Height: 27 cm Movement: Present     General:  Alert, oriented and cooperative. Patient is in no acute distress.  Skin: Skin is warm and dry. No rash noted.   Cardiovascular: Normal heart rate noted  Respiratory: Normal respiratory effort, no problems with respiration noted  Abdomen: Soft, gravid, appropriate for gestational age.  Pain/Pressure: Mild pressure     Pelvic: Cervical exam deferred        Extremities: Normal range of motion.  Edema: None  Mental Status: Normal mood and affect. Normal behavior. Normal judgment and thought content.   Assessment and Plan:  Pregnancy: G1P0 at [redacted]w[redacted]d 1. Supervision of other normal pregnancy, antepartum -Patient has no complaints and is doing well  2. Language barrier -interpreter present for encounter  3. GBS bacteriuria   4. [redacted] weeks gestation of pregnancy -Patient should increase fluid intake and incorporate more K+ rich foods into her diet to improve leg cramping     Preterm labor symptoms and general obstetric precautions including but not limited to vaginal bleeding, contractions, leaking of fluid and fetal movement were reviewed in detail with the patient. Please refer to After Visit Summary for other counseling recommendations.   Patient educated on the upcoming GTT at the next appointment.   Return in about 3 weeks (around 05/24/2020) for LROB/GTT.  Future Appointments  Date Time Provider [place]  05/18/2020  1:00 PM [facility] NURSE [facility]  05/18/2020  1:15 PM [facility] US2 [facility]  05/25/2020  8:50 AM [facility] [place]    Benson Setting, Student-PA

## 2020-05-05 ENCOUNTER — Encounter: Payer: Self-pay | Admitting: *Deleted

## 2020-05-18 ENCOUNTER — Ambulatory Visit: Payer: Medicaid Other | Admitting: *Deleted

## 2020-05-18 ENCOUNTER — Other Ambulatory Visit: Payer: Self-pay | Admitting: *Deleted

## 2020-05-18 ENCOUNTER — Ambulatory Visit: Payer: Medicaid Other | Attending: Obstetrics and Gynecology

## 2020-05-18 ENCOUNTER — Other Ambulatory Visit: Payer: Self-pay

## 2020-05-18 ENCOUNTER — Encounter: Payer: Self-pay | Admitting: *Deleted

## 2020-05-18 DIAGNOSIS — Z363 Encounter for antenatal screening for malformations: Secondary | ICD-10-CM | POA: Diagnosis not present

## 2020-05-18 DIAGNOSIS — Z3A27 27 weeks gestation of pregnancy: Secondary | ICD-10-CM

## 2020-05-18 DIAGNOSIS — Z3480 Encounter for supervision of other normal pregnancy, unspecified trimester: Secondary | ICD-10-CM

## 2020-05-18 DIAGNOSIS — O3412 Maternal care for benign tumor of corpus uteri, second trimester: Secondary | ICD-10-CM

## 2020-05-18 DIAGNOSIS — O289 Unspecified abnormal findings on antenatal screening of mother: Secondary | ICD-10-CM

## 2020-05-18 DIAGNOSIS — D219 Benign neoplasm of connective and other soft tissue, unspecified: Secondary | ICD-10-CM

## 2020-05-18 DIAGNOSIS — O358XX0 Maternal care for other (suspected) fetal abnormality and damage, not applicable or unspecified: Secondary | ICD-10-CM | POA: Insufficient documentation

## 2020-05-18 DIAGNOSIS — O26842 Uterine size-date discrepancy, second trimester: Secondary | ICD-10-CM

## 2020-05-18 DIAGNOSIS — D259 Leiomyoma of uterus, unspecified: Secondary | ICD-10-CM

## 2020-05-18 DIAGNOSIS — O35EXX0 Maternal care for other (suspected) fetal abnormality and damage, fetal genitourinary anomalies, not applicable or unspecified: Secondary | ICD-10-CM

## 2020-05-24 ENCOUNTER — Other Ambulatory Visit: Payer: Self-pay

## 2020-05-24 DIAGNOSIS — Z3480 Encounter for supervision of other normal pregnancy, unspecified trimester: Secondary | ICD-10-CM

## 2020-05-25 ENCOUNTER — Other Ambulatory Visit: Payer: Medicaid Other

## 2020-05-25 ENCOUNTER — Other Ambulatory Visit: Payer: Self-pay

## 2020-05-25 ENCOUNTER — Ambulatory Visit (INDEPENDENT_AMBULATORY_CARE_PROVIDER_SITE_OTHER): Payer: Medicaid Other | Admitting: Obstetrics and Gynecology

## 2020-05-25 VITALS — BP 97/65 | HR 115 | Wt 141.4 lb

## 2020-05-25 DIAGNOSIS — Z3480 Encounter for supervision of other normal pregnancy, unspecified trimester: Secondary | ICD-10-CM

## 2020-05-25 DIAGNOSIS — Z789 Other specified health status: Secondary | ICD-10-CM | POA: Diagnosis not present

## 2020-05-25 DIAGNOSIS — Z3A28 28 weeks gestation of pregnancy: Secondary | ICD-10-CM | POA: Diagnosis not present

## 2020-05-25 DIAGNOSIS — O99820 Streptococcus B carrier state complicating pregnancy: Secondary | ICD-10-CM | POA: Diagnosis not present

## 2020-05-25 DIAGNOSIS — R8271 Bacteriuria: Secondary | ICD-10-CM

## 2020-05-25 MED ORDER — PREPLUS 27-1 MG PO TABS: 1.0000 | ORAL_TABLET | Freq: Every day | ORAL | 6 refills | Status: AC

## 2020-05-25 NOTE — Patient Instructions (Signed)
Please go to Vaccines.gov to find a local vaccine site or call [phone]

## 2020-05-25 NOTE — Progress Notes (Signed)
   PRENATAL VISIT NOTE  Subjective:  Meagan Vance is a 29 y.o. G1P0 at [redacted]w[redacted]d being seen today for ongoing prenatal care.  She is currently monitored for the following issues for this low-risk pregnancy and has Supervision of other normal pregnancy, antepartum; Language barrier; GBS (group B streptococcus) UTI complicating pregnancy; and Uterine fibroids affecting pregnancy, antepartum on their problem list.   Patient reports no complaints.  Contractions: Not present. Vag. Bleeding: None.  Movement: Present. Denies leaking of fluid.   Having baby boy.   The following portions of the patient's history were reviewed and updated as appropriate: allergies, current medications, past family history, past medical history, past social history, past surgical history and problem list.   Objective:   Vitals:   05/25/20 0921  BP: 97/65  Pulse: (!) 115  Weight: 141 lb 6.4 oz (64.1 kg)    Fetal Status: Fetal Heart Rate (bpm): 150   Movement: Present     General:  Alert, oriented and cooperative. Patient is in no acute distress.  Skin: Skin is warm and dry. No rash noted.   Cardiovascular: Normal heart rate noted  Respiratory: Normal respiratory effort, no problems with respiration noted  Abdomen: Soft, gravid, appropriate for gestational age.  Pain/Pressure: Absent     Pelvic: Cervical exam deferred        Extremities: Normal range of motion.  Edema: None  Mental Status: Normal mood and affect. Normal behavior. Normal judgment and thought content.   Assessment and Plan:  Pregnancy: G1P0 at [redacted]w[redacted]d 1. Supervision of other normal pregnancy, antepartum - Prenatal Vit-Fe Fumarate-FA (PREPLUS) 27-1 MG TABS; Take 1 tablet by mouth daily.  Dispense: 30 tablet; Refill: 6 -provided information on Tdap vaccine. Patient undecided.  -discussed covid 19 and tdap vaccine today. Patient undecided on both.   COVID-19 Vaccine Counseling: The patient was counseled on the potential benefits and lack of  known risks of COVID vaccination, during pregnancy and breastfeeding, during today's visit. The patient's questions and concerns were addressed today, including safety of the vaccination and potential side effects as they have been published by ACOG and [facility]. The patient has been informed that there have not been any documented vaccine related injuries, deaths or birth defects to infant or mom after receiving the COVID-19 vaccine to date. The patient has been made aware that although she is not at increased risk of contracting COVID-19 during pregnancy, she is at increased risk of developing severe disease and complications if she contracts COVID-19 while pregnant. All patient questions were addressed during our visit today. The patient is still unsure of her decision for vaccination.    2. Language barrier Video interpreter used today   3. GBS bacteriuria   4. [redacted] weeks gestation of pregnancy -2hr gtt, cbc, rpr, hiv obtained today   Preterm labor symptoms and general obstetric precautions including but not limited to vaginal bleeding, contractions, leaking of fluid and fetal movement were reviewed in detail with the patient. Please refer to After Visit Summary for other counseling recommendations.   Return in about 2 weeks (around 06/08/2020) for OB, any provider.  Future Appointments  Date Time Provider [place]  07/13/2020 12:30 PM [facility] NURSE [facility]  07/13/2020 12:45 PM [facility]    Janet Berlin, MD

## 2020-05-26 LAB — HIV ANTIBODY (ROUTINE TESTING W REFLEX): HIV Screen 4th Generation wRfx: NONREACTIVE

## 2020-05-26 LAB — CBC
Hematocrit: 33.2 % — ABNORMAL LOW (ref 34.0–46.6)
Hemoglobin: 10.4 g/dL — ABNORMAL LOW (ref 11.1–15.9)
MCH: 26.9 pg (ref 26.6–33.0)
MCHC: 31.3 g/dL — ABNORMAL LOW (ref 31.5–35.7)
MCV: 86 fL (ref 79–97)
Platelets: 249 10*3/uL (ref 150–450)
RBC: 3.87 x10E6/uL (ref 3.77–5.28)
RDW: 14.6 % (ref 11.7–15.4)
WBC: 6.9 10*3/uL (ref 3.4–10.8)

## 2020-05-26 LAB — RPR: RPR Ser Ql: NONREACTIVE

## 2020-05-26 LAB — GLUCOSE TOLERANCE, 2 HOURS W/ 1HR
Glucose, 1 hour: 116 mg/dL (ref 65–179)
Glucose, 2 hour: 108 mg/dL (ref 65–152)
Glucose, Fasting: 84 mg/dL (ref 65–91)

## 2020-05-28 ENCOUNTER — Encounter (HOSPITAL_COMMUNITY): Payer: Self-pay | Admitting: Obstetrics and Gynecology

## 2020-05-28 ENCOUNTER — Other Ambulatory Visit: Payer: Self-pay

## 2020-05-28 ENCOUNTER — Inpatient Hospital Stay (HOSPITAL_COMMUNITY): Payer: Medicaid Other

## 2020-05-28 ENCOUNTER — Inpatient Hospital Stay (HOSPITAL_COMMUNITY)
Admission: AD | Admit: 2020-05-28 | Discharge: 2020-05-28 | Disposition: A | Discharge status: Home / Self Care | Payer: Medicaid Other | Attending: Obstetrics and Gynecology | Admitting: Obstetrics and Gynecology

## 2020-05-28 DIAGNOSIS — Z3A29 29 weeks gestation of pregnancy: Secondary | ICD-10-CM

## 2020-05-28 DIAGNOSIS — Z789 Other specified health status: Secondary | ICD-10-CM

## 2020-05-28 DIAGNOSIS — R1031 Right lower quadrant pain: Secondary | ICD-10-CM | POA: Diagnosis present

## 2020-05-28 DIAGNOSIS — D259 Leiomyoma of uterus, unspecified: Secondary | ICD-10-CM

## 2020-05-28 DIAGNOSIS — O3413 Maternal care for benign tumor of corpus uteri, third trimester: Secondary | ICD-10-CM | POA: Diagnosis not present

## 2020-05-28 DIAGNOSIS — D251 Intramural leiomyoma of uterus: Secondary | ICD-10-CM | POA: Insufficient documentation

## 2020-05-28 DIAGNOSIS — O99891 Other specified diseases and conditions complicating pregnancy: Secondary | ICD-10-CM | POA: Diagnosis not present

## 2020-05-28 DIAGNOSIS — R188 Other ascites: Secondary | ICD-10-CM

## 2020-05-28 LAB — URINALYSIS, ROUTINE W REFLEX MICROSCOPIC
Bilirubin Urine: NEGATIVE
Glucose, UA: NEGATIVE mg/dL
Hgb urine dipstick: NEGATIVE
Ketones, ur: NEGATIVE mg/dL
Leukocytes,Ua: NEGATIVE
Nitrite: NEGATIVE
Protein, ur: NEGATIVE mg/dL
Specific Gravity, Urine: 1.016 (ref 1.005–1.030)
pH: 6 (ref 5.0–8.0)

## 2020-05-28 LAB — COMPREHENSIVE METABOLIC PANEL
ALT: 12 U/L (ref 0–44)
AST: 19 U/L (ref 15–41)
Albumin: 3 g/dL — ABNORMAL LOW (ref 3.5–5.0)
Alkaline Phosphatase: 54 U/L (ref 38–126)
Anion gap: 8 (ref 5–15)
BUN: 6 mg/dL (ref 6–20)
CO2: 24 mmol/L (ref 22–32)
Calcium: 9.5 mg/dL (ref 8.9–10.3)
Chloride: 103 mmol/L (ref 98–111)
Creatinine, Ser: 0.54 mg/dL (ref 0.44–1.00)
GFR, Estimated: >60 mL/min (ref >60)
Glucose, Bld: 86 mg/dL (ref 70–99)
Potassium: 4.2 mmol/L (ref 3.5–5.1)
Sodium: 135 mmol/L (ref 135–145)
Total Bilirubin: 0.6 mg/dL (ref 0.3–1.2)
Total Protein: 6.8 g/dL (ref 6.5–8.1)

## 2020-05-28 LAB — CBC WITH DIFFERENTIAL/PLATELET
Abs Immature Granulocytes: 0.05 10*3/uL (ref 0.00–0.07)
Basophils Absolute: 0 10*3/uL (ref 0.0–0.1)
Basophils Relative: 0 %
Eosinophils Absolute: 0.1 10*3/uL (ref 0.0–0.5)
Eosinophils Relative: 1 %
HCT: 33.8 % — ABNORMAL LOW (ref 36.0–46.0)
Hemoglobin: 10.7 g/dL — ABNORMAL LOW (ref 12.0–15.0)
Immature Granulocytes: 1 %
Lymphocytes Relative: 18 %
Lymphs Abs: 1.7 10*3/uL (ref 0.7–4.0)
MCH: 27.6 pg (ref 26.0–34.0)
MCHC: 31.7 g/dL (ref 30.0–36.0)
MCV: 87.3 fL (ref 80.0–100.0)
Monocytes Absolute: 0.9 10*3/uL (ref 0.1–1.0)
Monocytes Relative: 10 %
Neutro Abs: 6.5 10*3/uL (ref 1.7–7.7)
Neutrophils Relative %: 70 %
Platelets: 260 10*3/uL (ref 150–400)
RBC: 3.87 MIL/uL (ref 3.87–5.11)
RDW: 15.4 % (ref 11.5–15.5)
WBC: 9.2 10*3/uL (ref 4.0–10.5)
nRBC: 0 % (ref 0.0–0.2)

## 2020-05-28 LAB — FETAL FIBRONECTIN: Fetal Fibronectin: NEGATIVE

## 2020-05-28 MED ORDER — HYDROMORPHONE HCL 1 MG/ML IJ SOLN
1.0000 mg | Freq: Once | INTRAMUSCULAR | Status: DC
  Filled 2020-05-28: qty 1

## 2020-05-28 MED ORDER — TRAMADOL HCL 50 MG PO TABS
50.0000 mg | ORAL_TABLET | Freq: Four times a day (QID) | ORAL | 0 refills | Status: DC | PRN
Start: 2020-05-28 — End: 2020-08-09

## 2020-05-28 MED ORDER — TERBUTALINE SULFATE 1 MG/ML IJ SOLN: 0.2500 mg | Freq: Once | INTRAMUSCULAR | Status: AC

## 2020-05-28 MED ORDER — LACTATED RINGERS IV SOLN: INTRAVENOUS | Status: DC

## 2020-05-28 MED ORDER — TERBUTALINE SULFATE 1 MG/ML IJ SOLN
INTRAMUSCULAR | Status: AC
  Administered 2020-05-28: 0.25 mg via SUBCUTANEOUS
  Filled 2020-05-28: qty 1

## 2020-05-28 MED ORDER — HYDROMORPHONE HCL 1 MG/ML IJ SOLN
1.0000 mg | Freq: Once | INTRAMUSCULAR | Status: AC
  Administered 2020-05-28: 1 mg via INTRAVENOUS

## 2020-05-28 NOTE — Progress Notes (Signed)
Pt's BP low, denies lightheadedness or dizziness.  Resting quietly in left lateral position, asymptomatic without complaints.

## 2020-05-28 NOTE — MAU Provider Note (Addendum)
History     CSN: [id number]  Arrival date and time: 05/28/20 1416   First Provider Initiated Contact with Patient 05/28/20 1557      Chief Complaint  Patient presents with  . Abdominal Pain   HPI  Ms.Meagan Vance is a 29 y.o. female G1P0 @ [redacted]w[redacted]d with a history of large uterine fibroids,  here with acute onset of RLQ pain that started this morning. Reports having a hard time standing from the sitting position. Reports the pain is improved if she is laying still. However is worse with movement or standing.  The pain comes and goes. When she moves she rates her pain 6/10. No bleeding noted. + fetal movement. She has never had this pain before. She has not taken any medication for the pain. No fever.  No N/V.  No past surgeries.   [place] interpretor used.   OB History    Gravida  1   Para      Term      Preterm      AB      Living        SAB      TAB      Ectopic      Multiple      Live Births              Past Medical History:  Diagnosis Date  . Medical history non-contributory     Past Surgical History:  Procedure Laterality Date  . NO PAST SURGERIES      Family History  Problem Relation Age of Onset  . Hypertension Maternal Uncle     Social History   Tobacco Use  . Smoking status: Never Smoker  . Smokeless tobacco: Never Used  Substance Use Topics  . Alcohol use: Yes    Comment: a little  . Drug use: Never    Allergies: No Known Allergies  Medications Prior to Admission  Medication Sig Dispense Refill Last Dose  . ferrous sulfate (FERROUSUL) 325 (65 FE) MG tablet Take 1 tablet (325 mg total) by mouth every other day. 60 tablet 1 05/28/2020 at Unknown time  . Prenatal Vit-Fe Fumarate-FA (PREPLUS) 27-1 MG TABS Take 1 tablet by mouth daily. 30 tablet 6 05/28/2020 at Unknown time   Results for orders placed or performed during the hospital encounter of 05/28/20 (from the past 48 hour(s))  Urinalysis, Routine w reflex microscopic  Urine, Clean Catch     Status: Abnormal   Collection Time: 05/28/20  3:20 PM  Result Value Ref Range   Color, Urine AMBER (A) YELLOW    Comment: BIOCHEMICALS MAY BE AFFECTED BY COLOR   APPearance CLOUDY (A) CLEAR   Specific Gravity, Urine 1.016 1.005 - 1.030   pH 6.0 5.0 - 8.0   Glucose, UA NEGATIVE NEGATIVE mg/dL   Hgb urine dipstick NEGATIVE NEGATIVE   Bilirubin Urine NEGATIVE NEGATIVE   Ketones, ur NEGATIVE NEGATIVE mg/dL   Protein, ur NEGATIVE NEGATIVE mg/dL   Nitrite NEGATIVE NEGATIVE   Leukocytes,Ua NEGATIVE NEGATIVE    Comment: Performed at [facility] [address] [place], [place] [address]  Fetal fibronectin     Status: None   Collection Time: 05/28/20  4:30 PM  Result Value Ref Range   Fetal Fibronectin NEGATIVE NEGATIVE    Comment: Performed at [facility], [place] [address], [place], [place] [address]  CBC with Differential/Platelet     Status: Abnormal   Collection Time: 05/28/20  4:46 PM  Result Value Ref Range   WBC 9.2 4.0 - 10.5 K/uL   RBC 3.87 3.87 - 5.11 MIL/uL   Hemoglobin 10.7 (L) 12.0 - 15.0 g/dL   HCT 33.8 (L) 36 - 46 %   MCV 87.3 80.0 - 100.0 fL   MCH 27.6 26.0 - 34.0 pg   MCHC 31.7 30.0 - 36.0 g/dL   RDW 15.4 11.5 - 15.5 %   Platelets 260 150 - 400 K/uL   nRBC 0.0 0.0 - 0.2 %   Neutrophils Relative % 70 %   Neutro Abs 6.5 1.7 - 7.7 K/uL   Lymphocytes Relative 18 %   Lymphs Abs 1.7 0.7 - 4.0 K/uL   Monocytes Relative 10 %   Monocytes Absolute 0.9 0.1 - 1.0 K/uL   Eosinophils Relative 1 %   Eosinophils Absolute 0.1 0.0 - 0.5 K/uL   Basophils Relative 0 %   Basophils Absolute 0.0 0.0 - 0.1 K/uL   Immature Granulocytes 1 %   Abs Immature Granulocytes 0.05 0.00 - 0.07 K/uL    Comment: Performed at [facility], [address], [place], [place] [address]  Comprehensive metabolic panel     Status: Abnormal   Collection Time: 05/28/20  4:46 PM  Result Value Ref Range   Sodium 135 135 - 145 mmol/L   Potassium 4.2 3.5 - 5.1 mmol/L    Chloride 103 98 - 111 mmol/L   CO2 24 22 - 32 mmol/L   Glucose, Bld 86 70 - 99 mg/dL    Comment: Glucose reference range applies only to samples taken after fasting for at least 8 hours.   BUN 6 6 - 20 mg/dL   Creatinine, Ser 0.54 0.44 - 1.00 mg/dL   Calcium 9.5 8.9 - 10.3 mg/dL   Total Protein 6.8 6.5 - 8.1 g/dL   Albumin 3.0 (L) 3.5 - 5.0 g/dL   AST 19 15 - 41 U/L   ALT 12 0 - 44 U/L   Alkaline Phosphatase 54 38 - 126 U/L   Total Bilirubin 0.6 0.3 - 1.2 mg/dL   GFR, Estimated >60 >60 mL/min    Comment: (NOTE) Calculated using the CKD-EPI Creatinine Equation (2021)    Anion gap 8 5 - 15    Comment: Performed at [facility] [address], [place], [place] [address]   Review of Systems  Constitutional: Negative for fever.  Gastrointestinal: Positive for abdominal pain. Negative for constipation, diarrhea, nausea and vomiting.  Genitourinary: Negative for dysuria, vaginal bleeding and vaginal discharge.   Physical Exam   Blood pressure (P) 100/60, pulse (!) (P) 104, temperature (P) 98 F (36.7 C), temperature source (P) Oral, resp. rate (P) 17, height (P) 4' 11.06" (1.5 m), weight (P) 64.5 kg, last menstrual period 11/07/2019, SpO2 (P) 100 %.  Physical Exam Constitutional:      General: She is in acute distress.     Appearance: She is well-developed. She is diaphoretic.  HENT:     Head: Normocephalic.  Abdominal:     Palpations: Abdomen is soft.     Tenderness: There is abdominal tenderness in the right lower quadrant. There is guarding and rebound.     Comments: Patient yelled when bed was moved from sitting to supine.   Neurological:     Mental Status: She is alert and oriented to person, place, and time.   Fetal Tracing: Baseline: 140 bpm Variability: Moderate  Accelerations: 15x15 Decelerations: 1656 prolonged deceleration down to 90 bpm with return to  Baseline.  Toco: irregular, frequent.   MAU Course  Procedures  None  MDM  FFN collected and  negative LR bolus with 1 mg of dilaudid CBC & CMP MRI pelvis/ abdomen ordered Results of MRI pending, report given to [name] who resumes care of the patient.  Lezlie Lye, NP 05/28/2020 8:10 PM   Imaging results:  MR PELVIS WO CONTRAST  Result Date: 05/28/2020 CLINICAL DATA:  Right lower quadrant abdominal pain, possible appendicitis. Third trimester pregnancy. EXAM: MRI ABDOMEN AND PELVIS WITHOUT CONTRAST TECHNIQUE: Multiplanar multisequence MR imaging of the abdomen and pelvis was performed. No intravenous contrast was administered. COMPARISON:  None. FINDINGS: COMBINED FINDINGS FOR BOTH MR ABDOMEN AND PELVIS Lower chest: Unremarkable Hepatobiliary: Unremarkable Pancreas:  Unremarkable Spleen:  Unremarkable Adrenals/Urinary Tract: Partially duplicated left renal collecting system. Adrenal glands unremarkable. No significant maternal hydronephrosis. Stomach/Bowel: Extends between the cecum and the uterus and along the right psoas muscle for example on images 18 through 24 of series 5. This has normal appearance for appendix. Mild prominence of stool in the cecum and ascending colon but not in the rest of the colon. No dilated bowel identified. Vascular/Lymphatic:  Unremarkable Reproductive: Single intrauterine pregnancy noted. Anterior placenta, no previa. Closed cervix. Along the anterior uterine margin, a 9.3 by 5.5 by 7.6 cm low T2 signal intensity mass is present in the myometrium, favoring a fibroid. Along the right side of the uterus, a subserosal 6.7 by 4.6 by 6.3 cm mass is present, and thought to be a subserosal fibroid for example on image 35 of series 5. In the fundus of the uterus, a 3.8 by 1.9 by 3.0 cm intramural fibroid is present for example on image 22 of series 6. The presence of fibroids has been documented on prior ultrasounds. Mild indistinctness of the right ovary. However, there is a 5.0 by 1.5 by 4.5 cm (volume = 18 cm^3) collection of ascites in the right  lower quadrant, and a smaller collection of fluid in the left lower quadrant. Other:  No supplemental non-categorized findings. Musculoskeletal: Unremarkable IMPRESSION: 1. The appendix is normal. 2. There is a volume of 18 cubic cm ascites in the right lower quadrant, and a smaller collection of fluid in the left lower quadrant. Etiology uncertain. 3. Uterine fibroids. 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 Electronically Signed   By: Van Clines M.D.   On: 05/28/2020 20:16   MR ABDOMEN WO CONTRAST  Result Date: 05/28/2020 CLINICAL DATA:  Right lower quadrant abdominal pain, possible appendicitis. Third trimester pregnancy. EXAM: MRI ABDOMEN AND PELVIS WITHOUT CONTRAST TECHNIQUE: Multiplanar multisequence MR imaging of the abdomen and pelvis was performed. No intravenous contrast was administered. COMPARISON:  None. FINDINGS: COMBINED FINDINGS FOR BOTH MR ABDOMEN AND PELVIS Lower chest: Unremarkable Hepatobiliary: Unremarkable Pancreas:  Unremarkable Spleen:  Unremarkable Adrenals/Urinary Tract: Partially duplicated left renal collecting system. Adrenal glands unremarkable. No significant maternal hydronephrosis. Stomach/Bowel: Extends between the cecum and the uterus and along the right psoas muscle for example on images 18 through 24 of series 5. This has normal appearance for appendix. Mild prominence of stool in the cecum and ascending colon but not in the rest of the colon. No dilated bowel identified. Vascular/Lymphatic:  Unremarkable Reproductive: Single intrauterine pregnancy noted. Anterior placenta, no previa. Closed cervix. Along the anterior uterine margin, a 9.3 by 5.5 by 7.6 cm low T2 signal intensity mass is present in the myometrium, favoring a fibroid. Along the right side of the uterus, a subserosal 6.7 by 4.6 by  6.3 cm mass is present, and thought to be a subserosal fibroid for example on image 35 of series 5. In the fundus of the uterus, a 3.8 by 1.9 by 3.0 cm  intramural fibroid is present for example on image 22 of series 6. The presence of fibroids has been documented on prior ultrasounds. Mild indistinctness of the right ovary. However, there is a 5.0 by 1.5 by 4.5 cm (volume = 18 cm^3) collection of ascites in the right lower quadrant, and a smaller collection of fluid in the left lower quadrant. Other:  No supplemental non-categorized findings. Musculoskeletal: Unremarkable IMPRESSION: 1. The appendix is normal. 2. There is a volume of 18 cubic cm ascites in the right lower quadrant, and a smaller collection of fluid in the left lower quadrant. Etiology uncertain. 3. Uterine fibroids. 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 Electronically Signed   By: Van Clines M.D.   On: 05/28/2020 20:16   Medical Mgmt: Pt without acute abdomen, pain minimal 3 hours after last dose of pain medication given.  Given minimal symptoms, and evidence of closed cervix earlier by NP, cervix not reexamined by CNM at time fo discharge.   MRI results with ascites in abdomen that is unexplained.  Consult Dr Rosana Hoes, general surgery, hospitalist, and GI on call.  Vernice Jefferson, NP, assisted with consult of medical departments.  Dr Posey Pronto, from [facility], recommends setting up follow up outpatient with IR for possible drain of ascites fluid.  Consult IR physician, Aletta Edouard, who recommends serial abdominal US outpatient, then consider drain if worsening. Message sent to [facility] to set up outpatient abdominal US in 1 week.  Precautions/reasons to return reviewed with pt.   All communication done with language line with french language  Assessment and Plan   1. Other ascites   2. Uterine fibroids affecting pregnancy, antepartum   3. Acute right lower quadrant pain   4. [redacted] weeks gestation of pregnancy   5. Language barrier     D/C home F/U with outpatient abdominal US  [name], CNM 3:48 AM

## 2020-05-28 NOTE — MAU Note (Signed)
Having pain in RLQ.  Started this morning.  Hurts when she sits, can't lay down because of the pain. No bleeding or LOF.  Pt is constipated (last was 11/11).  Denies urinary problems.

## 2020-05-28 NOTE — Progress Notes (Signed)
To MRI via wheelchair, transported by hospital transporter.

## 2020-05-28 NOTE — Progress Notes (Signed)
Failed IV attempt by M. Konrad Saha, RN

## 2020-05-29 ENCOUNTER — Other Ambulatory Visit: Payer: Self-pay | Admitting: Advanced Practice Midwife

## 2020-05-29 DIAGNOSIS — R188 Other ascites: Secondary | ICD-10-CM

## 2020-06-02 ENCOUNTER — Telehealth: Payer: Self-pay | Admitting: Advanced Practice Midwife

## 2020-06-02 NOTE — Telephone Encounter (Signed)
Attempted to reach patient twice about her appointment changing. She needed to see an MD. Her phone was not working.

## 2020-06-07 ENCOUNTER — Telehealth: Payer: Self-pay

## 2020-06-07 ENCOUNTER — Other Ambulatory Visit: Payer: Self-pay

## 2020-06-07 ENCOUNTER — Ambulatory Visit (INDEPENDENT_AMBULATORY_CARE_PROVIDER_SITE_OTHER): Payer: Medicaid Other | Admitting: Obstetrics & Gynecology

## 2020-06-07 ENCOUNTER — Encounter: Payer: Self-pay | Admitting: Obstetrics & Gynecology

## 2020-06-07 VITALS — BP 93/60 | HR 114 | Wt 140.0 lb

## 2020-06-07 DIAGNOSIS — Z23 Encounter for immunization: Secondary | ICD-10-CM

## 2020-06-07 DIAGNOSIS — Z3480 Encounter for supervision of other normal pregnancy, unspecified trimester: Secondary | ICD-10-CM | POA: Diagnosis not present

## 2020-06-07 DIAGNOSIS — D259 Leiomyoma of uterus, unspecified: Secondary | ICD-10-CM

## 2020-06-07 DIAGNOSIS — R188 Other ascites: Secondary | ICD-10-CM

## 2020-06-07 DIAGNOSIS — O3410 Maternal care for benign tumor of corpus uteri, unspecified trimester: Secondary | ICD-10-CM

## 2020-06-07 NOTE — Patient Instructions (Signed)
Pelvic Mass, Female  A pelvic mass is an abnormal growth in the pelvis. The pelvis is the area between your hip bones. It includes the bladder, rectum, uterus, and ovaries. A pelvic mass may be found during a routine pelvic exam or while performing an MRI, CT scan, or ultrasound for other problems of the abdomen. What are common types of pelvic masses? Pelvic masses include:  Ovarian cysts. These are fluid-filled sacs that form on an ovary.  Tumors. These may be cancerous (malignant) or noncancerous (benign). Noncancerous tumors in the uterus are called uterine fibroids.  Ectopic pregnancy. This is when the fertilized egg attaches (implants) outside the uterus.  Infections. What type of testing may be needed? Your health care provider may recommend that you have tests to diagnose the cause of the pelvic mass. The following tests may be done if a pelvic mass is found:  Physical exam.  Blood tests.  X-rays.  Ultrasound.  CT scan.  MRI.  A surgery to look inside your abdomen with cameras (laparoscopy).  A biopsy that is performed with a needle or during laparoscopy or surgery. In some cases, what seemed like a pelvic mass may actually be something else, such as a mass in one of the organs that is near the pelvis, an infection (abscess), or scar tissue (adhesions) that formed after a surgery. Tests and physical exams may be done once, or they may be done regularly for a period of time. Tests and exams that are done regularly will help monitor whether the mass or tissue change is growing and becoming a concern. What are common treatments? Treatment is not always needed for this condition. Your health care provider may recommend careful monitoring (watchful waiting) and regular tests and exams. Treatment will depend on the cause of the mass. Follow these instructions at home:  What you need to do at home will depend on the cause of the mass. Follow the instructions that your health  care provider gives to you. In general: ? Keep all follow-up visits as directed by your health care provider. This is important. ? Take over-the-counter and prescription medicines only as directed by your health care provider. ? If you were prescribed an antibiotic medicine, take it as told by your health care provider. Do not stop taking the antibiotic even if you start to feel better. ? Follow any restrictions that are given to you by your health care provider.  Try to stay calm, and be sure to ask questions. Make sure you understand the recommendations for monitoring and whether there is a reason for concern. Contact a health care provider if you:  Develop new symptoms.  Note changes in the size, shape, or position of your mass.  Are unable to have a bowel movement.  Bruise or bleed easily. Get help right away if you:  Vomit bright red blood or vomit material that looks like coffee grounds.  Have blood in your stools, or the stools turn black and tarry.  Have an abnormal or increased amount of vaginal bleeding.  Have a fever or chills.  Develop sudden or worsening pain that is not relieved by medicine.  Feel dizzy or weak.  Feel light-headed or you faint.  Feel that the mass has suddenly gotten larger.  Develop severe bloating in your abdomen or your pelvis.  Cannot pass any urine. Summary  A pelvic mass is an abnormal growth in the pelvis. The pelvis is the area between your hip bones. It includes the bladder, rectum,  uterus, and ovaries.  Pelvic masses include ovarian cysts, tumors, ectopic pregnancy, or infections.  Your health care provider may recommend that you have tests to diagnose the cause of the pelvic mass.  Treatment will depend on the cause of the mass. This information is not intended to replace advice given to you by your health care provider. Make sure you discuss any questions you have with your health care provider. Document Revised: 07/25/2017  Document Reviewed: 07/25/2017 Elsevier Patient Education  2020 [employer].

## 2020-06-07 NOTE — Telephone Encounter (Signed)
Spoke with Meagan Vance from [facility] to schedule their patient for an MD Consult. Patient is being referred by [name].   Was able to get the patient scheduled for [place] MFM OB FOLLOW UP and an MD Consult at 9:30am on 07/06/20. She will notify the patient of the appointment information.

## 2020-06-07 NOTE — Progress Notes (Signed)
   PRENATAL VISIT NOTE  Subjective:  Meagan Vance is a 29 y.o. G1P0 at [redacted]w[redacted]d being seen today for ongoing prenatal care.  She is currently monitored for the following issues for this high-risk pregnancy and has Supervision of other normal pregnancy, antepartum; Language barrier; GBS (group B streptococcus) UTI complicating pregnancy; Uterine fibroids affecting pregnancy, antepartum; and Abdominal ascites on their problem list.  Patient reports no complaints.  Contractions: Not present. Vag. Bleeding: None.  Movement: Present. Denies leaking of fluid. Was seen 11/12 for RLQ pain, unknown ascites noted, as well as large fibroids noted. Pain improved since visit.   The following portions of the patient's history were reviewed and updated as appropriate: allergies, current medications, past family history, past medical history, past social history, past surgical history and problem list.   Objective:   Vitals:   06/07/20 1049  BP: 93/60  Pulse: (!) 114  Weight: 140 lb (63.5 kg)    Fetal Status: Fetal Heart Rate (bpm): 155   Movement: Present     General:  Alert, oriented and cooperative. Patient is in no acute distress.  Skin: Skin is warm and dry. No rash noted.   Cardiovascular: Normal heart rate noted  Respiratory: Normal respiratory effort, no problems with respiration noted  Abdomen: Soft, gravid, appropriate for gestational age.  Pain/Pressure: Absent     Pelvic: Cervical exam deferred        Extremities: Normal range of motion.  Edema: None  Mental Status: Normal mood and affect. Normal behavior. Normal judgment and thought content.   Assessment and Plan:  Pregnancy: G1P0 at [redacted]w[redacted]d 1. Supervision of other normal pregnancy, antepartum - Tdap vaccine greater than or equal to 7yo IM  2. Other ascites Pain improved since MAU visit  3. Uterine fibroids affecting pregnancy, antepartum MFM consult ordered for subsequent imaging.   Preterm labor symptoms and general obstetric  precautions including but not limited to vaginal bleeding, contractions, leaking of fluid and fetal movement were reviewed in detail with the patient. Please refer to After Visit Summary for other counseling recommendations.   No follow-ups on file.  Future Appointments  Date Time Provider [place]  07/13/2020 12:30 PM [facility] NURSE [facility]  07/13/2020 12:45 PM [facility] US5 [facility]    Cherre Blanc, MD

## 2020-06-08 ENCOUNTER — Encounter: Payer: Medicaid Other | Admitting: Advanced Practice Midwife

## 2020-06-17 ENCOUNTER — Telehealth: Payer: Self-pay

## 2020-06-17 NOTE — Telephone Encounter (Signed)
Called Pt using a [employer] ID# [phone], advised pt that her [place] is scheduled for 12/16/ @ 9 am at this building on [address] [place]. Pt verbalized understanding.

## 2020-06-17 NOTE — Telephone Encounter (Signed)
-----   Message from Sloan Leiter, MD sent at 06/17/2020 12:39 PM EST ----- Pt needs to be set up for outpatient abdominal US as ordered. Thanks.

## 2020-06-21 ENCOUNTER — Encounter: Payer: Self-pay | Admitting: Obstetrics & Gynecology

## 2020-06-21 ENCOUNTER — Ambulatory Visit (INDEPENDENT_AMBULATORY_CARE_PROVIDER_SITE_OTHER): Payer: Medicaid Other | Admitting: Obstetrics & Gynecology

## 2020-06-21 ENCOUNTER — Other Ambulatory Visit: Payer: Self-pay

## 2020-06-21 VITALS — BP 102/66 | HR 112 | Temp 98.8°F | Wt 144.0 lb

## 2020-06-21 DIAGNOSIS — D259 Leiomyoma of uterus, unspecified: Secondary | ICD-10-CM

## 2020-06-21 DIAGNOSIS — Z3480 Encounter for supervision of other normal pregnancy, unspecified trimester: Secondary | ICD-10-CM

## 2020-06-21 DIAGNOSIS — O3410 Maternal care for benign tumor of corpus uteri, unspecified trimester: Secondary | ICD-10-CM

## 2020-06-21 NOTE — Progress Notes (Signed)
   PRENATAL VISIT NOTE  Subjective:  Meagan Vance is a 29 y.o. G1P0 at [redacted]w[redacted]d being seen today for ongoing prenatal care.  She is currently monitored for the following issues for this high-risk pregnancy and has Supervision of other normal pregnancy, antepartum; Language barrier; GBS (group B streptococcus) UTI complicating pregnancy; Uterine fibroids affecting pregnancy, antepartum; and Abdominal ascites on their problem list.  Patient reports no complaints.  Contractions: Not present. Vag. Bleeding: None.  Movement: Present. Denies leaking of fluid.   The following portions of the patient's history were reviewed and updated as appropriate: allergies, current medications, past family history, past medical history, past social history, past surgical history and problem list.   Objective:   Vitals:   06/21/20 1408  BP: 102/66  Pulse: (!) 112  Temp: 98.8 F (37.1 C)  Weight: 144 lb (65.3 kg)    Fetal Status: Fetal Heart Rate (bpm): 150   Movement: Present     General:  Alert, oriented and cooperative. Patient is in no acute distress.  Skin: Skin is warm and dry. No rash noted.   Cardiovascular: Normal heart rate noted  Respiratory: Normal respiratory effort, no problems with respiration noted  Abdomen: Soft, gravid, appropriate for gestational age.  Pain/Pressure: Absent     Pelvic: Cervical exam deferred        Extremities: Normal range of motion.  Edema: None  Mental Status: Normal mood and affect. Normal behavior. Normal judgment and thought content.   Assessment and Plan:  Pregnancy: G1P0 at [redacted]w[redacted]d 1. Uterine fibroids affecting pregnancy, antepartum Follow up U/S scheduled for 12/16.   2. Supervision of other normal pregnancy, antepartum   Preterm labor symptoms and general obstetric precautions including but not limited to vaginal bleeding, contractions, leaking of fluid and fetal movement were reviewed in detail with the patient. Please refer to After Visit Summary for  other counseling recommendations.   No follow-ups on file.  Future Appointments  Date Time Provider [place]  07/01/2020  9:00 AM [facility] US1 [facility]  07/05/2020  1:55 PM Megan Salon, MD [facility]  07/06/2020  9:30 AM [facility] NURSE [facility]  07/06/2020  9:45 AM [facility] US5 [facility]  07/06/2020 11:00 AM [facility] MD RM [facility]    Cherre Blanc, MD

## 2020-06-23 ENCOUNTER — Other Ambulatory Visit: Payer: Self-pay | Admitting: Advanced Practice Midwife

## 2020-06-23 DIAGNOSIS — R109 Unspecified abdominal pain: Secondary | ICD-10-CM

## 2020-06-23 DIAGNOSIS — R188 Other ascites: Secondary | ICD-10-CM

## 2020-06-23 DIAGNOSIS — O26893 Other specified pregnancy related conditions, third trimester: Secondary | ICD-10-CM

## 2020-06-23 NOTE — Progress Notes (Signed)
[place] ordered placed for [place] Ascites (abdomen limited) scheduled on 07/01/20.

## 2020-07-01 ENCOUNTER — Other Ambulatory Visit: Payer: Self-pay

## 2020-07-01 ENCOUNTER — Encounter: Payer: Self-pay | Admitting: Obstetrics & Gynecology

## 2020-07-01 ENCOUNTER — Ambulatory Visit
Admission: RE | Admit: 2020-07-01 | Discharge: 2020-07-01 | Disposition: A | Discharge status: Home / Self Care | Payer: Medicaid Other | Source: Ambulatory Visit | Attending: Advanced Practice Midwife | Admitting: Advanced Practice Midwife

## 2020-07-01 DIAGNOSIS — O26893 Other specified pregnancy related conditions, third trimester: Secondary | ICD-10-CM | POA: Diagnosis present

## 2020-07-01 DIAGNOSIS — R188 Other ascites: Secondary | ICD-10-CM | POA: Diagnosis not present

## 2020-07-01 DIAGNOSIS — R109 Unspecified abdominal pain: Secondary | ICD-10-CM | POA: Diagnosis present

## 2020-07-05 ENCOUNTER — Encounter: Payer: Self-pay | Admitting: Obstetrics & Gynecology

## 2020-07-05 ENCOUNTER — Ambulatory Visit (INDEPENDENT_AMBULATORY_CARE_PROVIDER_SITE_OTHER): Payer: Medicaid Other | Admitting: Obstetrics & Gynecology

## 2020-07-05 ENCOUNTER — Other Ambulatory Visit: Payer: Self-pay

## 2020-07-05 VITALS — BP 94/67 | HR 123 | Wt 146.8 lb

## 2020-07-05 DIAGNOSIS — O321XX1 Maternal care for breech presentation, fetus 1: Secondary | ICD-10-CM

## 2020-07-05 DIAGNOSIS — D259 Leiomyoma of uterus, unspecified: Secondary | ICD-10-CM

## 2020-07-05 DIAGNOSIS — Z3A34 34 weeks gestation of pregnancy: Secondary | ICD-10-CM

## 2020-07-05 DIAGNOSIS — Z3480 Encounter for supervision of other normal pregnancy, unspecified trimester: Secondary | ICD-10-CM

## 2020-07-05 DIAGNOSIS — R8271 Bacteriuria: Secondary | ICD-10-CM

## 2020-07-05 DIAGNOSIS — R188 Other ascites: Secondary | ICD-10-CM

## 2020-07-05 DIAGNOSIS — O3410 Maternal care for benign tumor of corpus uteri, unspecified trimester: Secondary | ICD-10-CM

## 2020-07-05 NOTE — Progress Notes (Signed)
Declines covid vaccine information. Martine Bleecker,RN

## 2020-07-05 NOTE — Progress Notes (Signed)
   PRENATAL VISIT NOTE  Subjective:  Meagan Vance is a 29 y.o. G1P0 at [redacted]w[redacted]d being seen today for ongoing prenatal care.  She is currently monitored for the following issues for this high-risk pregnancy and has Supervision of other normal pregnancy, antepartum; Language barrier; GBS (group B streptococcus) UTI complicating pregnancy; Uterine fibroids affecting pregnancy, antepartum; and Abdominal ascites on their problem list.  Patient reports no complaints.  Contractions: Not present. Vag. Bleeding: None.  Movement: Present. Denies leaking of fluid.   The following portions of the patient's history were reviewed and updated as appropriate: allergies, current medications, past family history, past medical history, past social history, past surgical history and problem list.   Objective:   Vitals:   07/05/20 1350  BP: 94/67  Pulse: (!) 123  Weight: 146 lb 12.8 oz (66.6 kg)    Fetal Status: Fetal Heart Rate (bpm): 127   Movement: Present     General:  Alert, oriented and cooperative. Patient is in no acute distress.  Skin: Skin is warm and dry. No rash noted.   Cardiovascular: Normal heart rate noted  Respiratory: Normal respiratory effort, no problems with respiration noted  Abdomen: Soft, gravid, appropriate for gestational age.  Pain/Pressure: Absent     Pelvic: Cervical exam deferred        Extremities: Normal range of motion.  Edema: None  Mental Status: Normal mood and affect. Normal behavior. Normal judgment and thought content.   Assessment and Plan:  Pregnancy: G1P0 at [redacted]w[redacted]d 1. Supervision of other normal pregnancy, antepartum - Recheck 2 weeks - GC/Chl 2 weeks  2. Uterine fibroids affecting pregnancy, antepartum  3. Other ascites - QuantiFERON-TB Gold Plus - CEA - B Nat Peptide - Comprehensive metabolic panel  4. GBS bacteriuria - treated 02/26/2020.  Aware of need for antibiotic treatment if has vaginal delivery.  5. [redacted] weeks gestation of pregnancy  6.  Breech presentation on examination, fetus 1 - D/w pt version vs primary cesarean section.  With amount of fibroids, feel version success with be decreased.  Will plan to look again at next visit and make plan at that time.  Preterm labor symptoms and general obstetric precautions including but not limited to vaginal bleeding, contractions, leaking of fluid and fetal movement were reviewed in detail with the patient.  Please refer to After Visit Summary for other counseling recommendations.   Return in about 2 weeks (around 07/19/2020) for Office Ob visit (MD only).  Future Appointments  Date Time Provider [place]  07/06/2020  9:30 AM [facility] NURSE [facility]  07/06/2020  9:45 AM [facility] US5 [facility]  07/06/2020 11:00 AM [facility] MD RM [facility]    Megan Salon, MD

## 2020-07-06 ENCOUNTER — Encounter: Payer: Self-pay | Admitting: *Deleted

## 2020-07-06 ENCOUNTER — Ambulatory Visit (HOSPITAL_BASED_OUTPATIENT_CLINIC_OR_DEPARTMENT_OTHER): Payer: Medicaid Other

## 2020-07-06 ENCOUNTER — Ambulatory Visit: Payer: Medicaid Other

## 2020-07-06 ENCOUNTER — Ambulatory Visit: Payer: Medicaid Other | Attending: Obstetrics and Gynecology | Admitting: *Deleted

## 2020-07-06 DIAGNOSIS — O26842 Uterine size-date discrepancy, second trimester: Secondary | ICD-10-CM | POA: Insufficient documentation

## 2020-07-06 DIAGNOSIS — Z362 Encounter for other antenatal screening follow-up: Secondary | ICD-10-CM | POA: Insufficient documentation

## 2020-07-06 DIAGNOSIS — Z3A34 34 weeks gestation of pregnancy: Secondary | ICD-10-CM | POA: Diagnosis not present

## 2020-07-06 DIAGNOSIS — O3412 Maternal care for benign tumor of corpus uteri, second trimester: Secondary | ICD-10-CM | POA: Insufficient documentation

## 2020-07-06 DIAGNOSIS — Z3480 Encounter for supervision of other normal pregnancy, unspecified trimester: Secondary | ICD-10-CM

## 2020-07-06 DIAGNOSIS — D219 Benign neoplasm of connective and other soft tissue, unspecified: Secondary | ICD-10-CM

## 2020-07-06 DIAGNOSIS — O3413 Maternal care for benign tumor of corpus uteri, third trimester: Secondary | ICD-10-CM | POA: Diagnosis not present

## 2020-07-06 DIAGNOSIS — D259 Leiomyoma of uterus, unspecified: Secondary | ICD-10-CM

## 2020-07-06 DIAGNOSIS — O358XX0 Maternal care for other (suspected) fetal abnormality and damage, not applicable or unspecified: Secondary | ICD-10-CM

## 2020-07-06 DIAGNOSIS — O359XX0 Maternal care for (suspected) fetal abnormality and damage, unspecified, not applicable or unspecified: Secondary | ICD-10-CM | POA: Diagnosis not present

## 2020-07-06 DIAGNOSIS — O26843 Uterine size-date discrepancy, third trimester: Secondary | ICD-10-CM

## 2020-07-08 LAB — COMPREHENSIVE METABOLIC PANEL
ALT: 10 IU/L (ref 0–32)
AST: 16 IU/L (ref 0–40)
Albumin/Globulin Ratio: 1.4 (ref 1.2–2.2)
Albumin: 3.7 g/dL — ABNORMAL LOW (ref 3.9–5.0)
Alkaline Phosphatase: 91 IU/L (ref 44–121)
BUN/Creatinine Ratio: 6 — ABNORMAL LOW (ref 9–23)
BUN: 3 mg/dL — ABNORMAL LOW (ref 6–20)
Bilirubin Total: 0.2 mg/dL (ref 0.0–1.2)
CO2: 23 mmol/L (ref 20–29)
Calcium: 9.3 mg/dL (ref 8.7–10.2)
Chloride: 102 mmol/L (ref 96–106)
Creatinine, Ser: 0.52 mg/dL — ABNORMAL LOW (ref 0.57–1.00)
GFR calc Af Amer: 151 mL/min/{1.73_m2} (ref >59)
GFR calc non Af Amer: 131 mL/min/{1.73_m2} (ref >59)
Globulin, Total: 2.7 g/dL (ref 1.5–4.5)
Glucose: 117 mg/dL — ABNORMAL HIGH (ref 65–99)
Potassium: 3.8 mmol/L (ref 3.5–5.2)
Sodium: 136 mmol/L (ref 134–144)
Total Protein: 6.4 g/dL (ref 6.0–8.5)

## 2020-07-08 LAB — QUANTIFERON-TB GOLD PLUS
QuantiFERON Mitogen Value: 2.18 IU/mL
QuantiFERON Nil Value: 0.02 IU/mL
QuantiFERON TB1 Ag Value: 0 IU/mL
QuantiFERON TB2 Ag Value: 0.01 IU/mL
QuantiFERON-TB Gold Plus: NEGATIVE

## 2020-07-08 LAB — CEA: CEA: <0.3 ng/mL (ref 0.0–4.7)

## 2020-07-08 LAB — BRAIN NATRIURETIC PEPTIDE: BNP: 40 pg/mL (ref 0.0–100.0)

## 2020-07-13 ENCOUNTER — Ambulatory Visit: Payer: Medicaid Other

## 2020-07-17 NOTE — L&D Delivery Note (Addendum)
OB/GYN Faculty Practice Delivery Note  Meagan Vance is a 30 y.o. G1P1001 s/p vaginal delivery at [redacted]w[redacted]d. She was admitted for management of labor s/p PROM at 0300 on 08/12/20.   ROM: 32h 41m with clear fluid GBS Status: positive; adequate antibiotics prior to delivery Maximum Maternal Temperature: 100.22F  Labor Progress: On admission, FB was placed. Pt was started on pitocin and progressed to complete cervical dilation at 0915. She then had an uncomplicated delivery as noted below.  Delivery Date/Time: 08/13/20 at 1100 Delivery: Called to room and patient was complete and pushing. Head delivered ROA. No nuchal cord present. Shoulder and body delivered in usual fashion. Infant with spontaneous cry, placed on mother's abdomen, dried and stimulated s/p brief evaluation with nursing. Cord clamped x 2 after 1-minute delay, and cut by author under my direct supervision. Arterial cord gas (pH 7.149) was collected given initial Apgar score. Cord blood drawn. Placenta delivered spontaneously with gentle cord traction. Fundus firm with massage and Pitocin. Labia, perineum, vagina, and cervix were inspected, without evidence of lacerations.   Placenta: 3-vessel cord, intact, sent to L&D Complications: none Lacerations: none EBL: 961 ml s/p TXA, cytotec Analgesia: epidural  Infant: female  APGARs 6 & 9  Arterial Cord gas pH 7.149  weight 3625g  I was present for delivery of infant and placenta as noted above.  Guillermina City, MD OB/GYN Fellow, Faculty Practice

## 2020-07-20 ENCOUNTER — Other Ambulatory Visit: Payer: Self-pay

## 2020-07-20 ENCOUNTER — Other Ambulatory Visit (HOSPITAL_COMMUNITY)
Admission: RE | Admit: 2020-07-20 | Discharge: 2020-07-20 | Disposition: A | Discharge status: Home / Self Care | Payer: Medicaid Other | Source: Ambulatory Visit | Attending: Family Medicine | Admitting: Family Medicine

## 2020-07-20 ENCOUNTER — Encounter: Payer: Self-pay | Admitting: Family Medicine

## 2020-07-20 ENCOUNTER — Ambulatory Visit (INDEPENDENT_AMBULATORY_CARE_PROVIDER_SITE_OTHER): Payer: Medicaid Other | Admitting: Family Medicine

## 2020-07-20 VITALS — BP 105/59 | HR 90 | Wt 150.0 lb

## 2020-07-20 DIAGNOSIS — R188 Other ascites: Secondary | ICD-10-CM

## 2020-07-20 DIAGNOSIS — O2343 Unspecified infection of urinary tract in pregnancy, third trimester: Secondary | ICD-10-CM

## 2020-07-20 DIAGNOSIS — Z3480 Encounter for supervision of other normal pregnancy, unspecified trimester: Secondary | ICD-10-CM | POA: Insufficient documentation

## 2020-07-20 DIAGNOSIS — D259 Leiomyoma of uterus, unspecified: Secondary | ICD-10-CM

## 2020-07-20 DIAGNOSIS — Z789 Other specified health status: Secondary | ICD-10-CM

## 2020-07-20 DIAGNOSIS — O3410 Maternal care for benign tumor of corpus uteri, unspecified trimester: Secondary | ICD-10-CM

## 2020-07-20 DIAGNOSIS — B951 Streptococcus, group B, as the cause of diseases classified elsewhere: Secondary | ICD-10-CM

## 2020-07-20 NOTE — Progress Notes (Signed)
Language Resources Interpreter Joceylne U.

## 2020-07-20 NOTE — Patient Instructions (Addendum)
AREA PEDIATRIC/FAMILY PRACTICE PHYSICIANS  [facility] [address] [place], [place] [address] Phone - [phone]   Fax - [place] [address] [place], [place]  [address] Phone - [phone]   Fax - [phone]  [place]. [address] [place], [place]  [address] Phone - [phone]   Fax - [phone]  [facility] [address] [place], [place]  [address] Phone - [phone]   Fax - [phone]  [place] [address], [place], [place]  [address] Phone - [phone]   Fax - [place] [address], [id number] [place], [place]  [address] Phone - [phone]   Fax - [place] [facility] [place] [address] [place], [place]  [address] Phone - [phone]   Fax - [phone]  [place] [address] [place], [place], [place]  [address] Phone - [phone]   Fax - [place] [address] [place], [place]  [address] Phone - [phone]   Fax - [phone] [facility] [place]. [address] [place], [place]  [address] Phone - [phone]   Fax - [phone]  [place] [address] [place], [place]  [address] Phone - [phone]   Fax - [phone]  [facility] [place], [place], [place]  [address] Phone - [phone]   Fax - [place] [address], [place], [place]  [address] Phone - [phone]   Fax - [phone]  [facility] [address], [place], [place]  [address] Phone - [place], [place]  [address] Phone - [phone]   Fax - [place] [address] [place], [place]  [address] Phone - [phone]   Fax - [phone]  [place] [address] [place], [place]  [address] Phone - [phone]   Fax - [place]. [place], [place]  [address] Phone - [phone]   Fax - [place], [place], [place]  [address] Phone - [phone]   Fax - [place] [address], [place], [place]  [address] Phone - [phone]   Fax - [phone]  [place] [place] [address] [place], [place]  [address] Phone - [phone]   Fax - [place] [address] [place], [place]  [address] Phone - [phone]   Fax - [phone]  [place] [address] [place], [place]  [address] Phone - [phone]   Fax - [phone] [place] [id number] [place], [place]  [address] Phone - [phone]   Fax - [phone]  [place] [address] [place], [place]  [address] Phone - [phone]   Fax - [place] [address], [place],   [address] Phone - [phone]   Fax [phone]       Third Trimester of Pregnancy The third trimester is from week 28 through week 40 (months 7 through 9). The third trimester is a time when the unborn baby (fetus)  is growing rapidly. At the end of the ninth month, the fetus is about 20 inches in length and weighs 6-10 pounds. Body changes during your third trimester Your body will continue to go through many changes during pregnancy. The changes vary from woman to woman. During the third trimester:  Your weight will continue to increase. You can expect to gain 25-35 pounds (11-16 kg) by the end of the pregnancy.  You may begin to get stretch marks on your hips,  abdomen, and breasts.  You may urinate more often because the fetus is moving lower into your pelvis and pressing on your bladder.  You may develop or continue to have heartburn. This is caused by increased hormones that slow down muscles in the digestive tract.  You may develop or continue to have constipation because increased hormones slow digestion and cause the muscles that push waste through your intestines to relax.  You may develop hemorrhoids. These are swollen veins (varicose veins) in the rectum that can itch or be painful.  You may develop swollen, bulging veins (varicose veins) in your legs.  You may have increased body aches in the pelvis, back, or thighs. This is due to weight gain and increased hormones that are relaxing your joints.  You may have changes in your hair. These can include thickening of your hair, rapid growth, and changes in texture. Some women also have hair loss during or after pregnancy, or hair that feels dry or thin. Your hair will most likely return to normal after your baby is born.  Your breasts will continue to grow and they will continue to become tender. A yellow fluid (colostrum) may leak from your breasts. This is the first milk you are producing for your baby.  Your belly button may stick out.  You may notice more swelling in your hands, face, or ankles.  You may have increased tingling or numbness in your hands, arms, and legs. The skin on your belly may also feel numb.  You may feel short of breath because of your expanding uterus.  You may have more problems sleeping. This can be caused by the size of your belly, increased need to urinate, and an increase in your body's metabolism.  You may notice the fetus "dropping," or moving lower in your abdomen (lightening).  You may have increased vaginal discharge.  You may notice your joints feel loose and you may have pain around your pelvic bone. What to expect at prenatal visits You will  have prenatal exams every 2 weeks until week 36. Then you will have weekly prenatal exams. During a routine prenatal visit:  You will be weighed to make sure you and the baby are growing normally.  Your blood pressure will be taken.  Your abdomen will be measured to track your baby's growth.  The fetal heartbeat will be listened to.  Any test results from the previous visit will be discussed.  You may have a cervical check near your due date to see if your cervix has softened or thinned (effaced).  You will be tested for Group B streptococcus. This happens between 35 and 37 weeks. Your health care provider may ask you:  What your birth plan is.  How you are feeling.  If you are feeling the baby move.  If you have had any abnormal symptoms, such as leaking fluid, bleeding, severe headaches, or abdominal cramping.  If you are using any tobacco products, including cigarettes, chewing tobacco, and electronic cigarettes.  If  you have any questions. Other tests or screenings that may be performed during your third trimester include:  Blood tests that check for low iron levels (anemia).  Fetal testing to check the health, activity level, and growth of the fetus. Testing is done if you have certain medical conditions or if there are problems during the pregnancy.  Nonstress test (NST). This test checks the health of your baby to make sure there are no signs of problems, such as the baby not getting enough oxygen. During this test, a belt is placed around your belly. The baby is made to move, and its heart rate is monitored during movement. What is false labor? False labor is a condition in which you feel small, irregular tightenings of the muscles in the womb (contractions) that usually go away with rest, changing position, or drinking water. These are called Braxton Hicks contractions. Contractions may last for hours, days, or even weeks before true labor sets in. If contractions come at  regular intervals, become more frequent, increase in intensity, or become painful, you should see your health care provider. What are the signs of labor?  Abdominal cramps.  Regular contractions that start at 10 minutes apart and become stronger and more frequent with time.  Contractions that start on the top of the uterus and spread down to the lower abdomen and back.  Increased pelvic pressure and dull back pain.  A watery or bloody mucus discharge that comes from the vagina.  Leaking of amniotic fluid. This is also known as your "water breaking." It could be a slow trickle or a gush. Let your health care provider know if it has a color or strange odor. If you have any of these signs, call your health care provider right away, even if it is before your due date. Follow these instructions at home: Medicines  Follow your health care provider's instructions regarding medicine use. Specific medicines may be either safe or unsafe to take during pregnancy.  Take a prenatal vitamin that contains at least 600 micrograms (mcg) of folic acid.  If you develop constipation, try taking a stool softener if your health care provider approves. Eating and drinking   Eat a balanced diet that includes fresh fruits and vegetables, whole grains, good sources of protein such as meat, eggs, or tofu, and low-fat dairy. Your health care provider will help you determine the amount of weight gain that is right for you.  Avoid raw meat and uncooked cheese. These carry germs that can cause birth defects in the baby.  If you have low calcium intake from food, talk to your health care provider about whether you should take a daily calcium supplement.  Eat four or five small meals rather than three large meals a day.  Limit foods that are high in fat and processed sugars, such as fried and sweet foods.  To prevent constipation: ? Drink enough fluid to keep your urine clear or pale yellow. ? Eat foods that  are high in fiber, such as fresh fruits and vegetables, whole grains, and beans. Activity  Exercise only as directed by your health care provider. Most women can continue their usual exercise routine during pregnancy. Try to exercise for 30 minutes at least 5 days a week. Stop exercising if you experience uterine contractions.  Avoid heavy lifting.  Do not exercise in extreme heat or humidity, or at high altitudes.  Wear low-heel, comfortable shoes.  Practice good posture.  You may continue to have sex unless  your health care provider tells you otherwise. Relieving pain and discomfort  Take frequent breaks and rest with your legs elevated if you have leg cramps or low back pain.  Take warm sitz baths to soothe any pain or discomfort caused by hemorrhoids. Use hemorrhoid cream if your health care provider approves.  Wear a good support bra to prevent discomfort from breast tenderness.  If you develop varicose veins: ? Wear support pantyhose or compression stockings as told by your healthcare provider. ? Elevate your feet for 15 minutes, 3-4 times a day. Prenatal care  Write down your questions. Take them to your prenatal visits.  Keep all your prenatal visits as told by your health care provider. This is important. Safety  Wear your seat belt at all times when driving.  Make a list of emergency phone numbers, including numbers for family, friends, the hospital, and police and fire departments. General instructions  Avoid cat litter boxes and soil used by cats. These carry germs that can cause birth defects in the baby. If you have a cat, ask someone to clean the litter box for you.  Do not travel far distances unless it is absolutely necessary and only with the approval of your health care provider.  Do not use hot tubs, steam rooms, or saunas.  Do not drink alcohol.  Do not use any products that contain nicotine or tobacco, such as cigarettes and e-cigarettes. If you need  help quitting, ask your health care provider.  Do not use any medicinal herbs or unprescribed drugs. These chemicals affect the formation and growth of the baby.  Do not douche or use tampons or scented sanitary pads.  Do not cross your legs for long periods of time.  To prepare for the arrival of your baby: ? Take prenatal classes to understand, practice, and ask questions about labor and delivery. ? Make a trial run to the hospital. ? Visit the hospital and tour the maternity area. ? Arrange for maternity or paternity leave through employers. ? Arrange for family and friends to take care of pets while you are in the hospital. ? Purchase a rear-facing car seat and make sure you know how to install it in your car. ? Pack your hospital bag. ? Prepare the baby's nursery. Make sure to remove all pillows and stuffed animals from the baby's crib to prevent suffocation.  Visit your dentist if you have not gone during your pregnancy. Use a soft toothbrush to brush your teeth and be gentle when you floss. Contact a health care provider if:  You are unsure if you are in labor or if your water has broken.  You become dizzy.  You have mild pelvic cramps, pelvic pressure, or nagging pain in your abdominal area.  You have lower back pain.  You have persistent nausea, vomiting, or diarrhea.  You have an unusual or bad smelling vaginal discharge.  You have pain when you urinate. Get help right away if:  Your water breaks before 37 weeks.  You have regular contractions less than 5 minutes apart before 37 weeks.  You have a fever.  You are leaking fluid from your vagina.  You have spotting or bleeding from your vagina.  You have severe abdominal pain or cramping.  You have rapid weight loss or weight gain.  You have shortness of breath with chest pain.  You notice sudden or extreme swelling of your face, hands, ankles, feet, or legs.  Your baby makes fewer than 10 movements  in 2  hours.  You have severe headaches that do not go away when you take medicine.  You have vision changes. Summary  The third trimester is from week 28 through week 40, months 7 through 9. The third trimester is a time when the unborn baby (fetus) is growing rapidly.  During the third trimester, your discomfort may increase as you and your baby continue to gain weight. You may have abdominal, leg, and back pain, sleeping problems, and an increased need to urinate.  During the third trimester your breasts will keep growing and they will continue to become tender. A yellow fluid (colostrum) may leak from your breasts. This is the first milk you are producing for your baby.  False labor is a condition in which you feel small, irregular tightenings of the muscles in the womb (contractions) that eventually go away. These are called Braxton Hicks contractions. Contractions may last for hours, days, or even weeks before true labor sets in.  Signs of labor can include: abdominal cramps; regular contractions that start at 10 minutes apart and become stronger and more frequent with time; watery or bloody mucus discharge that comes from the vagina; increased pelvic pressure and dull back pain; and leaking of amniotic fluid. This information is not intended to replace advice given to you by your health care provider. Make sure you discuss any questions you have with your health care provider. Document Revised: 10/24/2018 Document Reviewed: 08/08/2016 Elsevier Patient Education  2020 [employer].   Contraception Choices Contraception, also called birth control, refers to methods or devices that prevent pregnancy. Hormonal methods Contraceptive implant  A contraceptive implant is a thin, plastic tube that contains a hormone. It is inserted into the upper part of the arm. It can remain in place for up to 3 years. Progestin-only injections Progestin-only injections are injections of progestin, a synthetic  form of the hormone progesterone. They are given every 3 months by a health care provider. Birth control pills  Birth control pills are pills that contain hormones that prevent pregnancy. They must be taken once a day, preferably at the same time each day. Birth control patch  The birth control patch contains hormones that prevent pregnancy. It is placed on the skin and must be changed once a week for three weeks and removed on the fourth week. A prescription is needed to use this method of contraception. Vaginal ring  A vaginal ring contains hormones that prevent pregnancy. It is placed in the vagina for three weeks and removed on the fourth week. After that, the process is repeated with a new ring. A prescription is needed to use this method of contraception. Emergency contraceptive Emergency contraceptives prevent pregnancy after unprotected sex. They come in pill form and can be taken up to 5 days after sex. They work best the sooner they are taken after having sex. Most emergency contraceptives are available without a prescription. This method should not be used as your only form of birth control. Barrier methods Female condom  A female condom is a thin sheath that is worn over the penis during sex. Condoms keep sperm from going inside a woman's body. They can be used with a spermicide to increase their effectiveness. They should be disposed after a single use. Female condom  A female condom is a soft, loose-fitting sheath that is put into the vagina before sex. The condom keeps sperm from going inside a woman's body. They should be disposed after a single use. Diaphragm  A diaphragm is a soft, dome-shaped barrier. It is inserted into the vagina before sex, along with a spermicide. The diaphragm blocks sperm from entering the uterus, and the spermicide kills sperm. A diaphragm should be left in the vagina for 6-8 hours after sex and removed within 24 hours. A diaphragm is prescribed and fitted  by a health care provider. A diaphragm should be replaced every 1-2 years, after giving birth, after gaining more than 15 lb (6.8 kg), and after pelvic surgery. Cervical cap  A cervical cap is a round, soft latex or plastic cup that fits over the cervix. It is inserted into the vagina before sex, along with spermicide. It blocks sperm from entering the uterus. The cap should be left in place for 6-8 hours after sex and removed within 48 hours. A cervical cap must be prescribed and fitted by a health care provider. It should be replaced every 2 years. Sponge  A sponge is a soft, circular piece of polyurethane foam with spermicide on it. The sponge helps block sperm from entering the uterus, and the spermicide kills sperm. To use it, you make it wet and then insert it into the vagina. It should be inserted before sex, left in for at least 6 hours after sex, and removed and thrown away within 30 hours. Spermicides Spermicides are chemicals that kill or block sperm from entering the cervix and uterus. They can come as a cream, jelly, suppository, foam, or tablet. A spermicide should be inserted into the vagina with an applicator at least [id number] minutes before sex to allow time for it to work. The process must be repeated every time you have sex. Spermicides do not require a prescription. Intrauterine contraception Intrauterine device (IUD) An IUD is a T-shaped device that is put in a woman's uterus. There are two types:  Hormone IUD.This type contains progestin, a synthetic form of the hormone progesterone. This type can stay in place for 3-5 years.  Copper IUD.This type is wrapped in copper wire. It can stay in place for 10 years.  Permanent methods of contraception Female tubal ligation In this method, a woman's fallopian tubes are sealed, tied, or blocked during surgery to prevent eggs from traveling to the uterus. Hysteroscopic sterilization In this method, a small, flexible insert is placed into  each fallopian tube. The inserts cause scar tissue to form in the fallopian tubes and block them, so sperm cannot reach an egg. The procedure takes about 3 months to be effective. Another form of birth control must be used during those 3 months. Female sterilization This is a procedure to tie off the tubes that carry sperm (vasectomy). After the procedure, the man can still ejaculate fluid (semen). Natural planning methods Natural family planning In this method, a couple does not have sex on days when the woman could become pregnant. Calendar method This means keeping track of the length of each menstrual cycle, identifying the days when pregnancy can happen, and not having sex on those days. Ovulation method In this method, a couple avoids sex during ovulation. Symptothermal method This method involves not having sex during ovulation. The woman typically checks for ovulation by watching changes in her temperature and in the consistency of cervical mucus. Post-ovulation method In this method, a couple waits to have sex until after ovulation. Summary  Contraception, also called birth control, means methods or devices that prevent pregnancy.  Hormonal methods of contraception include implants, injections, pills, patches, vaginal rings, and emergency contraceptives.  Barrier  methods of contraception can include female condoms, female condoms, diaphragms, cervical caps, sponges, and spermicides.  There are two types of IUDs (intrauterine devices). An IUD can be put in a woman's uterus to prevent pregnancy for 3-5 years.  Permanent sterilization can be done through a procedure for males, females, or both.  Natural family planning methods involve not having sex on days when the woman could become pregnant. This information is not intended to replace advice given to you by your health care provider. Make sure you discuss any questions you have with your health care provider. Document Revised:  07/05/2017 Document Reviewed: 08/05/2016 Elsevier Patient Education  2020 [employer].   Breastfeeding  Choosing to breastfeed is one of the best decisions you can make for yourself and your baby. A change in hormones during pregnancy causes your breasts to make breast milk in your milk-producing glands. Hormones prevent breast milk from being released before your baby is born. They also prompt milk flow after birth. Once breastfeeding has begun, thoughts of your baby, as well as his or her sucking or crying, can stimulate the release of milk from your milk-producing glands. Benefits of breastfeeding Research shows that breastfeeding offers many health benefits for infants and mothers. It also offers a cost-free and convenient way to feed your baby. For your baby  Your first milk (colostrum) helps your baby's digestive system to function better.  Special cells in your milk (antibodies) help your baby to fight off infections.  Breastfed babies are less likely to develop asthma, allergies, obesity, or type 2 diabetes. They are also at lower risk for sudden infant death syndrome (SIDS).  Nutrients in breast milk are better able to meet your baby's needs compared to infant formula.  Breast milk improves your baby's brain development. For you  Breastfeeding helps to create a very special bond between you and your baby.  Breastfeeding is convenient. Breast milk costs nothing and is always available at the correct temperature.  Breastfeeding helps to burn calories. It helps you to lose the weight that you gained during pregnancy.  Breastfeeding makes your uterus return faster to its size before pregnancy. It also slows bleeding (lochia) after you give birth.  Breastfeeding helps to lower your risk of developing type 2 diabetes, osteoporosis, rheumatoid arthritis, cardiovascular disease, and breast, ovarian, uterine, and endometrial cancer later in life. Breastfeeding basics Starting  breastfeeding  Find a comfortable place to sit or lie down, with your neck and back well-supported.  Place a pillow or a rolled-up blanket under your baby to bring him or her to the level of your breast (if you are seated). Nursing pillows are specially designed to help support your arms and your baby while you breastfeed.  Make sure that your baby's tummy (abdomen) is facing your abdomen.  Gently massage your breast. With your fingertips, massage from the outer edges of your breast inward toward the nipple. This encourages milk flow. If your milk flows slowly, you may need to continue this action during the feeding.  Support your breast with 4 fingers underneath and your thumb above your nipple (make the letter "C" with your hand). Make sure your fingers are well away from your nipple and your baby's mouth.  Stroke your baby's lips gently with your finger or nipple.  When your baby's mouth is open wide enough, quickly bring your baby to your breast, placing your entire nipple and as much of the areola as possible into your baby's mouth. The areola is the  colored area around your nipple. ? More areola should be visible above your baby's upper lip than below the lower lip. ? Your baby's lips should be opened and extended outward (flanged) to ensure an adequate, comfortable latch. ? Your baby's tongue should be between his or her lower gum and your breast.  Make sure that your baby's mouth is correctly positioned around your nipple (latched). Your baby's lips should create a seal on your breast and be turned out (everted).  It is common for your baby to suck about 2-3 minutes in order to start the flow of breast milk. Latching Teaching your baby how to latch onto your breast properly is very important. An improper latch can cause nipple pain, decreased milk supply, and poor weight gain in your baby. Also, if your baby is not latched onto your nipple properly, he or she may swallow some air  during feeding. This can make your baby fussy. Burping your baby when you switch breasts during the feeding can help to get rid of the air. However, teaching your baby to latch on properly is still the best way to prevent fussiness from swallowing air while breastfeeding. Signs that your baby has successfully latched onto your nipple  Silent tugging or silent sucking, without causing you pain. Infant's lips should be extended outward (flanged).  Swallowing heard between every 3-4 sucks once your milk has started to flow (after your let-down milk reflex occurs).  Muscle movement above and in front of his or her ears while sucking. Signs that your baby has not successfully latched onto your nipple  Sucking sounds or smacking sounds from your baby while breastfeeding.  Nipple pain. If you think your baby has not latched on correctly, slip your finger into the corner of your baby's mouth to break the suction and place it between your baby's gums. Attempt to start breastfeeding again. Signs of successful breastfeeding Signs from your baby  Your baby will gradually decrease the number of sucks or will completely stop sucking.  Your baby will fall asleep.  Your baby's body will relax.  Your baby will retain a small amount of milk in his or her mouth.  Your baby will let go of your breast by himself or herself. Signs from you  Breasts that have increased in firmness, weight, and size 1-3 hours after feeding.  Breasts that are softer immediately after breastfeeding.  Increased milk volume, as well as a change in milk consistency and color by the fifth day of breastfeeding.  Nipples that are not sore, cracked, or bleeding. Signs that your baby is getting enough milk  Wetting at least 1-2 diapers during the first 24 hours after birth.  Wetting at least 5-6 diapers every 24 hours for the first week after birth. The urine should be clear or pale yellow by the age of 5 days.  Wetting 6-8  diapers every 24 hours as your baby continues to grow and develop.  At least 3 stools in a 24-hour period by the age of 5 days. The stool should be soft and yellow.  At least 3 stools in a 24-hour period by the age of 7 days. The stool should be seedy and yellow.  No loss of weight greater than 10% of birth weight during the first 3 days of life.  Average weight gain of 4-7 oz (113-198 g) per week after the age of 4 days.  Consistent daily weight gain by the age of 5 days, without weight loss after the  age of 2 weeks. After a feeding, your baby may spit up a small amount of milk. This is normal. Breastfeeding frequency and duration Frequent feeding will help you make more milk and can prevent sore nipples and extremely full breasts (breast engorgement). Breastfeed when you feel the need to reduce the fullness of your breasts or when your baby shows signs of hunger. This is called "breastfeeding on demand." Signs that your baby is hungry include:  Increased alertness, activity, or restlessness.  Movement of the head from side to side.  Opening of the mouth when the corner of the mouth or cheek is stroked (rooting).  Increased sucking sounds, smacking lips, cooing, sighing, or squeaking.  Hand-to-mouth movements and sucking on fingers or hands.  Fussing or crying. Avoid introducing a pacifier to your baby in the first 4-6 weeks after your baby is born. After this time, you may choose to use a pacifier. Research has shown that pacifier use during the first year of a baby's life decreases the risk of sudden infant death syndrome (SIDS). Allow your baby to feed on each breast as long as he or she wants. When your baby unlatches or falls asleep while feeding from the first breast, offer the second breast. Because newborns are often sleepy in the first few weeks of life, you may need to awaken your baby to get him or her to feed. Breastfeeding times will vary from baby to baby. However, the  following rules can serve as a guide to help you make sure that your baby is properly fed:  Newborns (babies 70 weeks of age or younger) may breastfeed every 1-3 hours.  Newborns should not go without breastfeeding for longer than 3 hours during the day or 5 hours during the night.  You should breastfeed your baby a minimum of 8 times in a 24-hour period. Breast milk pumping     Pumping and storing breast milk allows you to make sure that your baby is exclusively fed your breast milk, even at times when you are unable to breastfeed. This is especially important if you go back to work while you are still breastfeeding, or if you are not able to be present during feedings. Your lactation consultant can help you find a method of pumping that works best for you and give you guidelines about how long it is safe to store breast milk. Caring for your breasts while you breastfeed Nipples can become dry, cracked, and sore while breastfeeding. The following recommendations can help keep your breasts moisturized and healthy:  Avoid using soap on your nipples.  Wear a supportive bra designed especially for nursing. Avoid wearing underwire-style bras or extremely tight bras (sports bras).  Air-dry your nipples for 3-4 minutes after each feeding.  Use only cotton bra pads to absorb leaked breast milk. Leaking of breast milk between feedings is normal.  Use lanolin on your nipples after breastfeeding. Lanolin helps to maintain your skin's normal moisture barrier. Pure lanolin is not harmful (not toxic) to your baby. You may also hand express a few drops of breast milk and gently massage that milk into your nipples and allow the milk to air-dry. In the first few weeks after giving birth, some women experience breast engorgement. Engorgement can make your breasts feel heavy, warm, and tender to the touch. Engorgement peaks within 3-5 days after you give birth. The following recommendations can help to ease  engorgement:  Completely empty your breasts while breastfeeding or pumping. You may want to  start by applying warm, moist heat (in the shower or with warm, water-soaked hand towels) just before feeding or pumping. This increases circulation and helps the milk flow. If your baby does not completely empty your breasts while breastfeeding, pump any extra milk after he or she is finished.  Apply ice packs to your breasts immediately after breastfeeding or pumping, unless this is too uncomfortable for you. To do this: ? Put ice in a plastic bag. ? Place a towel between your skin and the bag. ? Leave the ice on for 20 minutes, 2-3 times a day.  Make sure that your baby is latched on and positioned properly while breastfeeding. If engorgement persists after 48 hours of following these recommendations, contact your health care provider or a Science writer. Overall health care recommendations while breastfeeding  Eat 3 healthy meals and 3 snacks every day. Well-nourished mothers who are breastfeeding need an additional 450-500 calories a day. You can meet this requirement by increasing the amount of a balanced diet that you eat.  Drink enough water to keep your urine pale yellow or clear.  Rest often, relax, and continue to take your prenatal vitamins to prevent fatigue, stress, and low vitamin and mineral levels in your body (nutrient deficiencies).  Do not use any products that contain nicotine or tobacco, such as cigarettes and e-cigarettes. Your baby may be harmed by chemicals from cigarettes that pass into breast milk and exposure to secondhand smoke. If you need help quitting, ask your health care provider.  Avoid alcohol.  Do not use illegal drugs or marijuana.  Talk with your health care provider before taking any medicines. These include over-the-counter and prescription medicines as well as vitamins and herbal supplements. Some medicines that may be harmful to your baby can pass  through breast milk.  It is possible to become pregnant while breastfeeding. If birth control is desired, ask your health care provider about options that will be safe while breastfeeding your baby. Where to find more information: [employer]: www.llli.org Contact a health care provider if:  You feel like you want to stop breastfeeding or have become frustrated with breastfeeding.  Your nipples are cracked or bleeding.  Your breasts are red, tender, or warm.  You have: ? Painful breasts or nipples. ? A swollen area on either breast. ? A fever or chills. ? Nausea or vomiting. ? Drainage other than breast milk from your nipples.  Your breasts do not become full before feedings by the fifth day after you give birth.  You feel sad and depressed.  Your baby is: ? Too sleepy to eat well. ? Having trouble sleeping. ? More than 77 week old and wetting fewer than 6 diapers in a 24-hour period. ? Not gaining weight by 23 days of age.  Your baby has fewer than 3 stools in a 24-hour period.  Your baby's skin or the white parts of his or her eyes become yellow. Get help right away if:  Your baby is overly tired (lethargic) and does not want to wake up and feed.  Your baby develops an unexplained fever. Summary  Breastfeeding offers many health benefits for infant and mothers.  Try to breastfeed your infant when he or she shows early signs of hunger.  Gently tickle or stroke your baby's lips with your finger or nipple to allow the baby to open his or her mouth. Bring the baby to your breast. Make sure that much of the areola is in your  baby's mouth. Offer one side and burp the baby before you offer the other side.  Talk with your health care provider or lactation consultant if you have questions or you face problems as you breastfeed. This information is not intended to replace advice given to you by your health care provider. Make sure you discuss any questions you  have with your health care provider. Document Revised: 09/27/2017 Document Reviewed: 08/04/2016 Elsevier Patient Education  [place].

## 2020-07-20 NOTE — Progress Notes (Signed)
   Subjective:  Meagan Vance is a 30 y.o. G1P0 at [redacted]w[redacted]d being seen today for ongoing prenatal care.  She is currently monitored for the following issues for this high-risk pregnancy and has Supervision of other normal pregnancy, antepartum; Language barrier; GBS (group B streptococcus) UTI complicating pregnancy; Uterine fibroids affecting pregnancy, antepartum; and Abdominal ascites on their problem list.  Patient reports no complaints.  Contractions: Not present. Vag. Bleeding: None.  Movement: Present. Denies leaking of fluid.   The following portions of the patient's history were reviewed and updated as appropriate: allergies, current medications, past family history, past medical history, past social history, past surgical history and problem list. Problem list updated.  Objective:   Vitals:   07/20/20 1007  BP: (!) 105/59  Pulse: 90  Weight: 150 lb (68 kg)    Fetal Status: Fetal Heart Rate (bpm): 148   Movement: Present     General:  Alert, oriented and cooperative. Patient is in no acute distress.  Skin: Skin is warm and dry. No rash noted.   Cardiovascular: Normal heart rate noted  Respiratory: Normal respiratory effort, no problems with respiration noted  Abdomen: Soft, gravid, appropriate for gestational age. Pain/Pressure: Absent     Pelvic: Vag. Bleeding: None     Cervical exam deferred        Extremities: Normal range of motion.  Edema: None  Mental Status: Normal mood and affect. Normal behavior. Normal judgment and thought content.   Urinalysis:      Assessment and Plan:  Pregnancy: G1P0 at [redacted]w[redacted]d  1. Supervision of other normal pregnancy, antepartum Breech on last [place], confirmed cephalic at this visit Discussed contraception, still undecided GBS bacteriuria previously, no swabs collected today GC/Chl urine - GC/Chlamydia probe amp ()not at [facility]  2. Group B Streptococcus urinary tract infection affecting pregnancy in third trimester   3.  Uterine fibroids affecting pregnancy, antepartum   4. Other ascites Resolved on most recent US with reassuring labs  5. Language barrier [place] interpreter present throughout visit  Preterm labor symptoms and general obstetric precautions including but not limited to vaginal bleeding, contractions, leaking of fluid and fetal movement were reviewed in detail with the patient. Please refer to After Visit Summary for other counseling recommendations.  Return in 1 week (on 07/27/2020) for [facility], needs MD, ob visit.   Venora Maples, MD

## 2020-07-21 LAB — GC/CHLAMYDIA PROBE AMP (~~LOC~~) NOT AT ARMC
Chlamydia: NEGATIVE
Comment: NEGATIVE
Comment: NORMAL
Neisseria Gonorrhea: NEGATIVE

## 2020-07-28 ENCOUNTER — Ambulatory Visit (INDEPENDENT_AMBULATORY_CARE_PROVIDER_SITE_OTHER): Payer: Medicaid Other | Admitting: Obstetrics & Gynecology

## 2020-07-28 ENCOUNTER — Other Ambulatory Visit: Payer: Self-pay

## 2020-07-28 VITALS — BP 113/62 | HR 103 | Wt 153.3 lb

## 2020-07-28 DIAGNOSIS — O2343 Unspecified infection of urinary tract in pregnancy, third trimester: Secondary | ICD-10-CM

## 2020-07-28 DIAGNOSIS — B951 Streptococcus, group B, as the cause of diseases classified elsewhere: Secondary | ICD-10-CM

## 2020-07-28 DIAGNOSIS — Z3480 Encounter for supervision of other normal pregnancy, unspecified trimester: Secondary | ICD-10-CM

## 2020-07-28 NOTE — Progress Notes (Signed)
   PRENATAL VISIT NOTE  Subjective:  Meagan Vance is a 30 y.o. G1P0 at [redacted]w[redacted]d being seen today for ongoing prenatal care.  She is currently monitored for the following issues for this low-risk pregnancy and has Supervision of other normal pregnancy, antepartum; Language barrier; GBS (group B streptococcus) UTI complicating pregnancy; Uterine fibroids affecting pregnancy, antepartum; and Abdominal ascites on their problem list.  Patient reports noticed her gum bleed when she brushes.  Contractions: Not present. Vag. Bleeding: None.  Movement: Present. Denies leaking of fluid.   The following portions of the patient's history were reviewed and updated as appropriate: allergies, current medications, past family history, past medical history, past social history, past surgical history and problem list.   Objective:   Vitals:   07/28/20 1047  BP: 113/62  Pulse: (!) 103  Weight: 153 lb 4.8 oz (69.5 kg)    Fetal Status: Fetal Heart Rate (bpm): 148 Fundal Height: 38 cm Movement: Present     General:  Alert, oriented and cooperative. Patient is in no acute distress.  Skin: Skin is warm and dry. No rash noted.   Cardiovascular: Normal heart rate noted  Respiratory: Normal respiratory effort, no problems with respiration noted  Abdomen: Soft, gravid, appropriate for gestational age.  Pain/Pressure: Absent     Pelvic: Cervical exam deferred        Extremities: Normal range of motion.  Edema: Trace  Mental Status: Normal mood and affect. Normal behavior. Normal judgment and thought content.   Assessment and Plan:  Pregnancy: G1P0 at [redacted]w[redacted]d 1. Supervision of other normal pregnancy, antepartum Gums look normal, advised to floss  2. Group B Streptococcus urinary tract infection affecting pregnancy in third trimester Treat in labor  Term labor symptoms and general obstetric precautions including but not limited to vaginal bleeding, contractions, leaking of fluid and fetal movement were  reviewed in detail with the patient. Please refer to After Visit Summary for other counseling recommendations.   Return in about 1 week (around 08/04/2020).  No future appointments.  Emeterio Reeve, MD

## 2020-07-28 NOTE — Patient Instructions (Signed)

## 2020-08-09 ENCOUNTER — Encounter: Payer: Self-pay | Admitting: Obstetrics and Gynecology

## 2020-08-09 ENCOUNTER — Ambulatory Visit (INDEPENDENT_AMBULATORY_CARE_PROVIDER_SITE_OTHER): Payer: Medicaid Other | Admitting: Obstetrics and Gynecology

## 2020-08-09 ENCOUNTER — Other Ambulatory Visit: Payer: Self-pay

## 2020-08-09 VITALS — BP 115/71 | HR 104 | Wt 160.9 lb

## 2020-08-09 DIAGNOSIS — Z789 Other specified health status: Secondary | ICD-10-CM

## 2020-08-09 DIAGNOSIS — B951 Streptococcus, group B, as the cause of diseases classified elsewhere: Secondary | ICD-10-CM

## 2020-08-09 DIAGNOSIS — O2343 Unspecified infection of urinary tract in pregnancy, third trimester: Secondary | ICD-10-CM

## 2020-08-09 DIAGNOSIS — D259 Leiomyoma of uterus, unspecified: Secondary | ICD-10-CM

## 2020-08-09 DIAGNOSIS — Z3480 Encounter for supervision of other normal pregnancy, unspecified trimester: Secondary | ICD-10-CM

## 2020-08-09 DIAGNOSIS — O3410 Maternal care for benign tumor of corpus uteri, unspecified trimester: Secondary | ICD-10-CM

## 2020-08-09 NOTE — Progress Notes (Unsigned)
Subjective:  Meagan Vance is a 30 y.o. G1P0 at [redacted]w[redacted]d being seen today for ongoing prenatal care.  She is currently monitored for the following issues for this high-risk pregnancy and has Supervision of other normal pregnancy, antepartum; Language barrier; GBS (group B streptococcus) UTI complicating pregnancy; and Uterine fibroids affecting pregnancy, antepartum on their problem list.  Patient reports genernal discomforts of pregnancy.  Contractions: Not present. Vag. Bleeding: None.  Movement: Present. Denies leaking of fluid.   The following portions of the patient's history were reviewed and updated as appropriate: allergies, current medications, past family history, past medical history, past social history, past surgical history and problem list. Problem list updated.  Objective:   Vitals:   08/09/20 1606  BP: 115/71  Pulse: (!) 104  Weight: 160 lb 14.4 oz (73 kg)    Fetal Status: Fetal Heart Rate (bpm): 157   Movement: Present     General:  Alert, oriented and cooperative. Patient is in no acute distress.  Skin: Skin is warm and dry. No rash noted.   Cardiovascular: Normal heart rate noted  Respiratory: Normal respiratory effort, no problems with respiration noted  Abdomen: Soft, gravid, appropriate for gestational age. Pain/Pressure: Absent     Pelvic:  Cervical exam performed        Extremities: Normal range of motion.  Edema: None  Mental Status: Normal mood and affect. Normal behavior. Normal judgment and thought content.   Urinalysis:      Assessment and Plan:  Pregnancy: G1P0 at [redacted]w[redacted]d  1. Supervision of other normal pregnancy, antepartum Labor precautions IOL at 41 weeks, scheduled and orders placed  2. Group B Streptococcus urinary tract infection affecting pregnancy in third trimester Tx while in labor  3. Uterine fibroids affecting pregnancy, antepartum Stable  4. Language barrier Live interrupter used during today's visit  Term labor symptoms and  general obstetric precautions including but not limited to vaginal bleeding, contractions, leaking of fluid and fetal movement were reviewed in detail with the patient. Please refer to After Visit Summary for other counseling recommendations.  Return in about 1 week (around 08/16/2020) for OB visit, face to face, any provider.   Chancy Milroy, MD

## 2020-08-09 NOTE — Patient Instructions (Signed)

## 2020-08-09 NOTE — Progress Notes (Signed)
IOL-08/20/20 AM

## 2020-08-12 ENCOUNTER — Other Ambulatory Visit: Payer: Self-pay

## 2020-08-12 ENCOUNTER — Inpatient Hospital Stay (HOSPITAL_COMMUNITY)
Admission: AD | Admit: 2020-08-12 | Discharge: 2020-08-15 | DRG: 806 | Disposition: A | Discharge status: Home / Self Care | Payer: Medicaid Other | Attending: Obstetrics & Gynecology | Admitting: Obstetrics & Gynecology

## 2020-08-12 ENCOUNTER — Encounter (HOSPITAL_COMMUNITY): Payer: Self-pay | Admitting: Family Medicine

## 2020-08-12 ENCOUNTER — Inpatient Hospital Stay (HOSPITAL_COMMUNITY): Payer: Medicaid Other | Admitting: Anesthesiology

## 2020-08-12 DIAGNOSIS — Z3A40 40 weeks gestation of pregnancy: Secondary | ICD-10-CM | POA: Diagnosis not present

## 2020-08-12 DIAGNOSIS — R188 Other ascites: Secondary | ICD-10-CM | POA: Diagnosis present

## 2020-08-12 DIAGNOSIS — Z3A39 39 weeks gestation of pregnancy: Secondary | ICD-10-CM

## 2020-08-12 DIAGNOSIS — O99892 Other specified diseases and conditions complicating childbirth: Secondary | ICD-10-CM | POA: Diagnosis present

## 2020-08-12 DIAGNOSIS — O4292 Full-term premature rupture of membranes, unspecified as to length of time between rupture and onset of labor: Secondary | ICD-10-CM | POA: Diagnosis present

## 2020-08-12 DIAGNOSIS — O480 Post-term pregnancy: Secondary | ICD-10-CM | POA: Diagnosis present

## 2020-08-12 DIAGNOSIS — D62 Acute posthemorrhagic anemia: Secondary | ICD-10-CM | POA: Diagnosis not present

## 2020-08-12 DIAGNOSIS — D259 Leiomyoma of uterus, unspecified: Secondary | ICD-10-CM | POA: Diagnosis present

## 2020-08-12 DIAGNOSIS — O2340 Unspecified infection of urinary tract in pregnancy, unspecified trimester: Secondary | ICD-10-CM | POA: Diagnosis present

## 2020-08-12 DIAGNOSIS — D563 Thalassemia minor: Secondary | ICD-10-CM | POA: Diagnosis present

## 2020-08-12 DIAGNOSIS — O99824 Streptococcus B carrier state complicating childbirth: Secondary | ICD-10-CM | POA: Diagnosis present

## 2020-08-12 DIAGNOSIS — B951 Streptococcus, group B, as the cause of diseases classified elsewhere: Secondary | ICD-10-CM | POA: Diagnosis present

## 2020-08-12 DIAGNOSIS — O3410 Maternal care for benign tumor of corpus uteri, unspecified trimester: Secondary | ICD-10-CM | POA: Diagnosis present

## 2020-08-12 DIAGNOSIS — O9081 Anemia of the puerperium: Secondary | ICD-10-CM | POA: Diagnosis not present

## 2020-08-12 DIAGNOSIS — Z20822 Contact with and (suspected) exposure to covid-19: Secondary | ICD-10-CM | POA: Diagnosis present

## 2020-08-12 DIAGNOSIS — O26893 Other specified pregnancy related conditions, third trimester: Secondary | ICD-10-CM | POA: Diagnosis present

## 2020-08-12 DIAGNOSIS — Z3480 Encounter for supervision of other normal pregnancy, unspecified trimester: Secondary | ICD-10-CM

## 2020-08-12 DIAGNOSIS — O3413 Maternal care for benign tumor of corpus uteri, third trimester: Secondary | ICD-10-CM | POA: Diagnosis present

## 2020-08-12 DIAGNOSIS — O99820 Streptococcus B carrier state complicating pregnancy: Secondary | ICD-10-CM

## 2020-08-12 DIAGNOSIS — D219 Benign neoplasm of connective and other soft tissue, unspecified: Secondary | ICD-10-CM | POA: Diagnosis present

## 2020-08-12 DIAGNOSIS — Z789 Other specified health status: Secondary | ICD-10-CM | POA: Diagnosis present

## 2020-08-12 LAB — SARS CORONAVIRUS 2 BY RT PCR (HOSPITAL ORDER, PERFORMED IN ~~LOC~~ HOSPITAL LAB): SARS Coronavirus 2: NEGATIVE

## 2020-08-12 LAB — WET PREP, GENITAL
Clue Cells Wet Prep HPF POC: NONE SEEN
Sperm: NONE SEEN
Trich, Wet Prep: NONE SEEN
Yeast Wet Prep HPF POC: NONE SEEN

## 2020-08-12 LAB — CBC
HCT: 34.4 % — ABNORMAL LOW (ref 36.0–46.0)
Hemoglobin: 11.4 g/dL — ABNORMAL LOW (ref 12.0–15.0)
MCH: 29 pg (ref 26.0–34.0)
MCHC: 33.1 g/dL (ref 30.0–36.0)
MCV: 87.5 fL (ref 80.0–100.0)
Platelets: 185 10*3/uL (ref 150–400)
RBC: 3.93 MIL/uL (ref 3.87–5.11)
RDW: 14.9 % (ref 11.5–15.5)
WBC: 7.5 10*3/uL (ref 4.0–10.5)
nRBC: 0 % (ref 0.0–0.2)

## 2020-08-12 LAB — TYPE AND SCREEN
ABO/RH(D): A POS
Antibody Screen: NEGATIVE

## 2020-08-12 MED ORDER — FENTANYL CITRATE (PF) 100 MCG/2ML IJ SOLN
50.0000 ug | INTRAMUSCULAR | Status: DC | PRN
  Administered 2020-08-12: 100 ug via INTRAVENOUS
  Filled 2020-08-12: qty 2

## 2020-08-12 MED ORDER — OXYTOCIN-SODIUM CHLORIDE 30-0.9 UT/500ML-% IV SOLN: 1.0000 m[IU]/min | INTRAVENOUS | Status: DC

## 2020-08-12 MED ORDER — EPHEDRINE 5 MG/ML INJ: 10.0000 mg | INTRAVENOUS | Status: DC | PRN

## 2020-08-12 MED ORDER — DIPHENHYDRAMINE HCL 50 MG/ML IJ SOLN: 12.5000 mg | INTRAMUSCULAR | Status: DC | PRN

## 2020-08-12 MED ORDER — OXYCODONE-ACETAMINOPHEN 5-325 MG PO TABS: 1.0000 | ORAL_TABLET | ORAL | Status: DC | PRN

## 2020-08-12 MED ORDER — LACTATED RINGERS IV SOLN: INTRAVENOUS | Status: DC

## 2020-08-12 MED ORDER — PENICILLIN G POT IN DEXTROSE 60000 UNIT/ML IV SOLN
3.0000 10*6.[IU] | INTRAVENOUS | Status: DC
  Administered 2020-08-12 – 2020-08-13 (×4): 3 10*6.[IU] via INTRAVENOUS
  Filled 2020-08-12 (×4): qty 50

## 2020-08-12 MED ORDER — OXYCODONE-ACETAMINOPHEN 5-325 MG PO TABS: 2.0000 | ORAL_TABLET | ORAL | Status: DC | PRN

## 2020-08-12 MED ORDER — PHENYLEPHRINE 40 MCG/ML (10ML) SYRINGE FOR IV PUSH (FOR BLOOD PRESSURE SUPPORT): 80.0000 ug | PREFILLED_SYRINGE | INTRAVENOUS | Status: DC | PRN

## 2020-08-12 MED ORDER — LIDOCAINE HCL (PF) 1 % IJ SOLN
INTRAMUSCULAR | Status: DC | PRN
  Administered 2020-08-12: 3 mL via EPIDURAL
  Administered 2020-08-12: 5 mL via EPIDURAL
  Administered 2020-08-12: 2 mL via EPIDURAL

## 2020-08-12 MED ORDER — SODIUM CHLORIDE 0.9 % IV SOLN: 1.0000 g | INTRAVENOUS | Status: DC

## 2020-08-12 MED ORDER — OXYTOCIN BOLUS FROM INFUSION
333.0000 mL | Freq: Once | INTRAVENOUS | Status: AC
  Administered 2020-08-13: 333 mL via INTRAVENOUS

## 2020-08-12 MED ORDER — LIDOCAINE HCL (PF) 1 % IJ SOLN: 30.0000 mL | INTRAMUSCULAR | Status: DC | PRN

## 2020-08-12 MED ORDER — OXYTOCIN-SODIUM CHLORIDE 30-0.9 UT/500ML-% IV SOLN
1.0000 m[IU]/min | INTRAVENOUS | Status: DC
  Administered 2020-08-12: 2 m[IU]/min via INTRAVENOUS

## 2020-08-12 MED ORDER — ONDANSETRON HCL 4 MG/2ML IJ SOLN
4.0000 mg | Freq: Four times a day (QID) | INTRAMUSCULAR | Status: DC | PRN
  Administered 2020-08-13: 4 mg via INTRAVENOUS
  Filled 2020-08-12: qty 2

## 2020-08-12 MED ORDER — TERBUTALINE SULFATE 1 MG/ML IJ SOLN: 0.2500 mg | Freq: Once | INTRAMUSCULAR | Status: DC | PRN

## 2020-08-12 MED ORDER — LACTATED RINGERS IV SOLN
500.0000 mL | INTRAVENOUS | Status: DC | PRN
  Administered 2020-08-13: 500 mL via INTRAVENOUS

## 2020-08-12 MED ORDER — SOD CITRATE-CITRIC ACID 500-334 MG/5ML PO SOLN: 30.0000 mL | ORAL | Status: DC | PRN

## 2020-08-12 MED ORDER — OXYTOCIN-SODIUM CHLORIDE 30-0.9 UT/500ML-% IV SOLN
2.5000 [IU]/h | INTRAVENOUS | Status: DC
  Filled 2020-08-12 (×2): qty 500

## 2020-08-12 MED ORDER — LACTATED RINGERS IV SOLN
500.0000 mL | Freq: Once | INTRAVENOUS | Status: AC
  Administered 2020-08-12: 500 mL via INTRAVENOUS

## 2020-08-12 MED ORDER — ACETAMINOPHEN 325 MG PO TABS
650.0000 mg | ORAL_TABLET | ORAL | Status: DC | PRN
  Administered 2020-08-13: 650 mg via ORAL
  Filled 2020-08-12: qty 2

## 2020-08-12 MED ORDER — SODIUM CHLORIDE 0.9 % IV SOLN: 2.0000 g | Freq: Once | INTRAVENOUS | Status: DC

## 2020-08-12 MED ORDER — SODIUM CHLORIDE 0.9 % IV SOLN
5.0000 10*6.[IU] | Freq: Once | INTRAVENOUS | Status: AC
  Administered 2020-08-12: 5 10*6.[IU] via INTRAVENOUS
  Filled 2020-08-12: qty 5

## 2020-08-12 MED ORDER — FENTANYL-BUPIVACAINE-NACL 0.5-0.125-0.9 MG/250ML-% EP SOLN
12.0000 mL/h | EPIDURAL | Status: DC | PRN
  Administered 2020-08-12: 12 mL/h via EPIDURAL
  Filled 2020-08-12: qty 250

## 2020-08-12 NOTE — MAU Provider Note (Signed)
Event Date/Time   First Provider Initiated Contact with Patient 08/12/20 1052     S: Ms. Meagan Vance is a 30 y.o. G1P0 at [redacted]w[redacted]d  who presents to MAU today complaining of leaking of fluid since 0300 this morning. She endorses a small streak of blood with onset of leaking. She endorses irregular contractions. She reports normal fetal movement.    O: BP 111/65 (BP Location: Right Arm)   Pulse 89   Temp 98.2 F (36.8 C) (Oral)   Resp 17   Ht 4\' 11"  (1.499 m)   Wt 72.6 kg   LMP 11/07/2019   BMI 32.32 kg/m    GENERAL: Well-developed, well-nourished female in no acute distress.  HEAD: Normocephalic, atraumatic.  CHEST: Normal effort of breathing, regular heart rate ABDOMEN: Soft, nontender, gravid PELVIC: Normal external female genitalia. Vagina is pink and rugated. Cervix with normal contour, no lesions. Normal discharge.  Positive pooling.   Cervical exam: Deferred to labor team  Fetal Monitoring: Baseline: 135 Variability: Moderate Accelerations: 15 x 15 Decelerations: None Contractions: Irregular   A: SIUP at [redacted]w[redacted]d  Cat I tracing GBS + (bacteruria) S/p SSE Positive fern Cephalic via BSUS  P: Admit to L&D, report called to Dr. Gerrit Friends, CNM 08/12/2020 11:43 AM

## 2020-08-12 NOTE — Anesthesia Procedure Notes (Signed)
Epidural Patient location during procedure: OB Start time: 08/12/2020 8:22 PM End time: 08/12/2020 8:28 PM  Staffing Anesthesiologist: Suzette Battiest, MD Performed: anesthesiologist   Preanesthetic Checklist Completed: patient identified, IV checked, site marked, risks and benefits discussed, surgical consent, monitors and equipment checked, pre-op evaluation and timeout performed  Epidural Patient position: sitting Prep: DuraPrep and site prepped and draped Patient monitoring: continuous pulse ox and blood pressure Approach: midline Location: L4-L5 Injection technique: LOR air  Needle:  Needle type: Tuohy  Needle gauge: 17 G Needle length: 9 cm and 9 Needle insertion depth: 5 cm cm Catheter type: closed end flexible Catheter size: 19 Gauge Catheter at skin depth: 10 cm Test dose: negative  Assessment Events: blood not aspirated, injection not painful, no injection resistance, no paresthesia and negative IV test

## 2020-08-12 NOTE — Progress Notes (Signed)
Labor Progress Note Meagan Vance is a 30 y.o. G1P0 at [redacted]w[redacted]d presented for SOL with ROM at 0300.  S: Patient is resting, experiencing some pain from her contractions  O:  BP (!) 110/54   Pulse 79   Temp (!) 97.2 F (36.2 C) (Axillary)   Resp 16   Ht 4\' 11"  (1.499 m)   Wt 73 kg   LMP 11/07/2019   BMI 32.51 kg/m  EFM: baseline HR 135 bpm/mod variability/accels present, no decels  Contractions every 3-4 min   CVE: Dilation: 4 Effacement (%): 70 Cervical Position: Posterior Station: -2 Presentation: Vertex Exam by:: Dr Vanetta Shawl   A&P: 30 y.o. G1P0 [redacted]w[redacted]d presented for SOL with ROM at 0300 #Labor: ROM @ 0300. FB out at 1515 and cervix progressed to 4cm. Currently on 57mL/min of Pitocin. Will continue to titrate as clinically indicated. Will consider placing IUPC at next check   #Pain: Analgesics prn. Unsure about epidural  #FWB: Cat 1 strip   #GBS positive in urine. Treated 02/26/20. Currently treating with PCN #uterine fibroids: monitored throughout pregnancy. Will continue to monitor   [place] for [place] 5:38 PM

## 2020-08-12 NOTE — H&P (Addendum)
OBSTETRIC ADMISSION HISTORY AND PHYSICAL  Meagan Vance is a 30 y.o. female G1P0 with IUP at [redacted]w[redacted]d by LMP presenting for SOL with ROM ~3am today. She reports +FMs, no VB, no blurry vision, headaches or peripheral edema, and RUQ pain. She denies contractions. She plans on breast and bottle feeding. She is undecided for birth control. She desires circumcision for baby boy. She received her prenatal care at [facility] established at 15 weeks.  Dating: By LMP --->  Estimated Date of Delivery: 08/13/20  Sono: @[redacted]w[redacted]d , CWD, normal anatomy, Breech presentation, 2471 g, 32% EFW - Cephalic by BSUS today  Prenatal History/Complications: -- GBS Bacteriuria Previously: treated 02/26/20- will treat in labor -- Uterine Fibroids: monitored throughout pregnancy   Myomas [place] 05/18/20    Site                     L(cm)      W(cm)      D(cm)   Lt Lower                 7.4        6.1        6.7    Rt Upper                 6.3        3.7        5.3    Ant Mid                  3          3.8        3   - unchanged in size on last [place] 07/06/20 -- Ascites: diagnosed via MRI 05/28/21   resolved on [place] 07/01/21 -- 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 Medical history non-contributory     Past Surgical History: Past Surgical History:  Procedure Laterality Date  . NO PAST SURGERIES      Obstetrical History: OB History    Gravida  1   Para      Term      Preterm      AB      Living        SAB      IAB      Ectopic      Multiple      Live Births              Social History Social History   Socioeconomic History  . Marital status: Married    Spouse name: Kofivi Adjonou  . Number of children: Not on file  . Years of education: Not on file  . Highest education level: Not on file  Occupational History  . Not on file  Tobacco Use  . Smoking status: Never Smoker  . Smokeless tobacco: Never Used   Vaping Use  . Vaping Use: Never used  Substance and Sexual Activity  . Alcohol use: Yes    Comment: a little  . Drug use: Never  . Sexual activity: Yes    Birth control/protection: None  Other Topics Concern  . Not on file  Social History Narrative  . Not on file   Social Determinants of Health   Financial Resource Strain: Not on file  Food Insecurity: No Food Insecurity  . Worried About Charity fundraiser in the Last Year: Never true  . Ran Out of Food in  the Last Year: Never true  Transportation Needs: No Transportation Needs  . Lack of Transportation (Medical): No  . Lack of Transportation (Non-Medical): No  Physical Activity: Not on file  Stress: Not on file  Social Connections: Not on file    Family History: Family History  Problem Relation Age of Onset  . Hypertension Maternal Uncle     Allergies: No Known Allergies  Medications Prior to Admission  Medication Sig Dispense Refill Last Dose  . ferrous sulfate (FERROUSUL) 325 (65 FE) MG tablet Take 1 tablet (325 mg total) by mouth every other day. 60 tablet 1 08/11/2020 at Unknown time  . Prenatal Vit-Fe Fumarate-FA (PREPLUS) 27-1 MG TABS Take 1 tablet by mouth daily. 30 tablet 6 08/11/2020 at Unknown time    Review of Systems   All systems reviewed and negative except as stated in HPI  Blood pressure 111/69, pulse 85, temperature 98.2 F (36.8 C), temperature source Oral, resp. rate 16, height 4\' 11"  (1.499 m), weight 73 kg, last menstrual period 11/07/2019. General appearance: alert, cooperative, appears stated age and no distress Lungs: Normal breathing rate Heart: regular rate Abdomen: soft, gravid Extremities: Homans sign is negative, no sign of DVT Presentation: cephalic Fetal monitoringBaseline: 140 bpm, Variability: Good {> 6 bpm), Accelerations: Reactive and Decelerations: Absent Uterine activity: q 8-9 minutes Dilation: 1 Effacement (%): 70 Station: -3 Exam by:: Dr Vanetta Shawl   Prenatal  labs: ABO, Rh: --/--/A POS (01/27 1212) Antibody: PENDING (01/27 1212) Rubella: >33.00 (08/12 1458) RPR: Non Reactive (11/09 0903)  HBsAg: Negative (08/12 1458)  HIV: Non Reactive (11/09 0903)  GBS:   GBS bacteriuria previously 2 hr Glucola passed Genetic screening: NIPS low risk female AFP negative  - Silent 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 Anatomy US: Resolved renal pyelectasis, otherwise normal  Prenatal Transfer Tool  Maternal Diabetes: No Genetic Screening: Normal; MOB silent 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 Maternal Ultrasounds/Referrals: Fetal Kidney Anomalies (renal pyelectasis resolved) Fetal Ultrasounds or other Referrals:  None Maternal Substance Abuse:  No Significant Maternal Medications:  None Significant Maternal Lab Results: Group B Strep positive  Results for orders placed or performed during the hospital encounter of 08/12/20 (from the past 24 hour(s))  Wet prep, genital   Collection Time: 08/12/20 10:59 AM  Result Value Ref Range   Yeast Wet Prep HPF POC NONE SEEN NONE SEEN   Trich, Wet Prep NONE SEEN NONE SEEN   Clue Cells Wet Prep HPF POC NONE SEEN NONE SEEN   WBC, Wet Prep HPF POC MODERATE (A) NONE SEEN   Sperm NONE SEEN   SARS Coronavirus 2 by RT PCR (hospital order, performed in [place] hospital lab) Nasopharyngeal Nasopharyngeal Swab   Collection Time: 08/12/20 11:34 AM   Specimen: Nasopharyngeal Swab  Result Value Ref Range   SARS Coronavirus 2 NEGATIVE NEGATIVE  Type and screen   Collection Time: 08/12/20 12:12 PM  Result Value Ref Range   ABO/RH(D) A POS    Antibody Screen PENDING    Sample Expiration      08/15/2020,2359 Performed at [facility], [address] [place], [place] [address]     Patient Active Problem List   Diagnosis Date Noted  . Post-dates pregnancy 08/12/2020  . Uterine fibroids affecting pregnancy, antepartum 03/04/2020  . GBS (group B streptococcus) UTI  complicating pregnancy 37/16/9678  . Supervision of other normal pregnancy, antepartum 02/26/2020  . Language barrier 02/26/2020    Assessment/Plan:  Meagan Vance is a 30 y.o.  G1P0 at [redacted]w[redacted]d here for SOL/SROM ~3am.   #Labor: Foley bulb risks/benefits discussed with patient. FB placed with ease, insufflated with saline to 60cc. Reassess cervical exam after FB dislodged. Low dose pit started, titrate 2x2. #Pain: PRN; considering epidural #FWB: Category 1 strip #ID: GBS Bacteriuria- started penicillin/ COVID negative #MOF: Breast and Bottle #MOC: Undecided #Circ: Desires Circumcision #Multiple Myomas: expectant management in labor, PPH risk, will evaluate if CS occurs   Myomas [place] 05/18/20    Site                     L(cm)      W(cm)      D(cm)   Lt Lower                 7.4        6.1        6.7    Rt Upper                 6.3        3.7        5.3    Ant Mid                  3          3.8        3   - unchanged in size on last [place] 07/06/20 #Ascites: resolved 07/01/21, seen on MRI 05/28/21 w/ ~18 cm^3 #Carrier for alpha-thalassemia and Tay-Sachs, starting Hb 11.4  #Language barrier: [place] (understands most [place]), virtual interpretor at bedside.  Melvyn Neth, Medical Student  08/12/2020, 1:03 PM    Attestation of Attending Supervision of Medical Student: Evaluation and management procedures were performed by the Medical Student under my supervision, in which I was present the entire time. I confirm that I have verified the information documented in the medical student's note, and that I have also personally performed the physical exam, SVE, and foley bulb placement and all medical decision making activities.  I agree with management and plan as outlined in the documentation.    Jen Mow, DO [facility] [employer], [facility] 08/12/2020  1:40 PM

## 2020-08-12 NOTE — Anesthesia Preprocedure Evaluation (Signed)
Anesthesia Evaluation  Patient identified by MRN, date of birth, ID band Patient awake    Reviewed: Allergy & Precautions, Patient's Chart, lab work & pertinent test results  Airway Mallampati: II  TM Distance: >3 FB     Dental   Pulmonary neg pulmonary ROS,    Pulmonary exam normal        Cardiovascular negative cardio ROS Normal cardiovascular exam     Neuro/Psych negative neurological ROS     GI/Hepatic negative GI ROS, Neg liver ROS,   Endo/Other  negative endocrine ROS  Renal/GU negative Renal ROS     Musculoskeletal   Abdominal   Peds  Hematology negative hematology ROS (+)   Anesthesia Other Findings   Reproductive/Obstetrics (+) Pregnancy                             Lab Results  Component Value Date   WBC 7.5 08/12/2020   HGB 11.4 (L) 08/12/2020   HCT 34.4 (L) 08/12/2020   MCV 87.5 08/12/2020   PLT 185 08/12/2020   Lab Results  Component Value Date   CREATININE 0.52 (L) 07/05/2020   BUN 3 (L) 07/05/2020   NA 136 07/05/2020   K 3.8 07/05/2020   CL 102 07/05/2020   CO2 23 07/05/2020    Anesthesia Physical Anesthesia Plan  ASA: II  Anesthesia Plan: Epidural   Post-op Pain Management:    Induction:   PONV Risk Score and Plan: Treatment may vary due to age or medical condition  Airway Management Planned: Natural Airway  Additional Equipment:   Intra-op Plan:   Post-operative Plan:   Informed Consent: I have reviewed the patients History and Physical, chart, labs and discussed the procedure including the risks, benefits and alternatives for the proposed anesthesia with the patient or authorized representative who has indicated his/her understanding and acceptance.       Plan Discussed with:   Anesthesia Plan Comments:         Anesthesia Quick Evaluation

## 2020-08-12 NOTE — MAU Note (Signed)
Pt reports she felt her water break around 0300 this AM, clear fluid with some blood. Good FM, some mild contractions.

## 2020-08-13 ENCOUNTER — Encounter (HOSPITAL_COMMUNITY): Payer: Self-pay | Admitting: Obstetrics and Gynecology

## 2020-08-13 DIAGNOSIS — O99824 Streptococcus B carrier state complicating childbirth: Secondary | ICD-10-CM

## 2020-08-13 DIAGNOSIS — O480 Post-term pregnancy: Secondary | ICD-10-CM

## 2020-08-13 DIAGNOSIS — Z3A40 40 weeks gestation of pregnancy: Secondary | ICD-10-CM

## 2020-08-13 LAB — RPR: RPR Ser Ql: NONREACTIVE

## 2020-08-13 MED ORDER — ONDANSETRON HCL 4 MG PO TABS: 4.0000 mg | ORAL_TABLET | ORAL | Status: DC | PRN

## 2020-08-13 MED ORDER — DIPHENHYDRAMINE HCL 25 MG PO CAPS: 25.0000 mg | ORAL_CAPSULE | Freq: Four times a day (QID) | ORAL | Status: DC | PRN

## 2020-08-13 MED ORDER — WITCH HAZEL-GLYCERIN EX PADS: 1.0000 "application " | MEDICATED_PAD | CUTANEOUS | Status: DC | PRN

## 2020-08-13 MED ORDER — DIBUCAINE (PERIANAL) 1 % EX OINT: 1.0000 "application " | TOPICAL_OINTMENT | CUTANEOUS | Status: DC | PRN

## 2020-08-13 MED ORDER — PRENATAL MULTIVITAMIN CH
1.0000 | ORAL_TABLET | Freq: Every day | ORAL | Status: DC
  Administered 2020-08-14 – 2020-08-15 (×2): 1 via ORAL
  Filled 2020-08-13 (×2): qty 1

## 2020-08-13 MED ORDER — ONDANSETRON HCL 4 MG/2ML IJ SOLN: 4.0000 mg | INTRAMUSCULAR | Status: DC | PRN

## 2020-08-13 MED ORDER — COCONUT OIL OIL
1.0000 | TOPICAL_OIL | Status: DC | PRN
Start: 2020-08-13 — End: 2020-08-15

## 2020-08-13 MED ORDER — IBUPROFEN 600 MG PO TABS
600.0000 mg | ORAL_TABLET | Freq: Four times a day (QID) | ORAL | Status: DC
  Administered 2020-08-13 – 2020-08-15 (×7): 600 mg via ORAL
  Filled 2020-08-13 (×8): qty 1

## 2020-08-13 MED ORDER — MISOPROSTOL 200 MCG PO TABS
ORAL_TABLET | ORAL | Status: AC
  Administered 2020-08-13: 1000 ug
  Filled 2020-08-13: qty 5

## 2020-08-13 MED ORDER — TRANEXAMIC ACID-NACL 1000-0.7 MG/100ML-% IV SOLN
INTRAVENOUS | Status: AC
  Administered 2020-08-13: 1000 mg
  Filled 2020-08-13: qty 100

## 2020-08-13 MED ORDER — BENZOCAINE-MENTHOL 20-0.5 % EX AERO: 1.0000 "application " | INHALATION_SPRAY | CUTANEOUS | Status: DC | PRN

## 2020-08-13 MED ORDER — SIMETHICONE 80 MG PO CHEW: 80.0000 mg | CHEWABLE_TABLET | ORAL | Status: DC | PRN

## 2020-08-13 MED ORDER — TETANUS-DIPHTH-ACELL PERTUSSIS 5-2.5-18.5 LF-MCG/0.5 IM SUSY: 0.5000 mL | PREFILLED_SYRINGE | Freq: Once | INTRAMUSCULAR | Status: DC

## 2020-08-13 MED ORDER — SENNOSIDES-DOCUSATE SODIUM 8.6-50 MG PO TABS
2.0000 | ORAL_TABLET | Freq: Every day | ORAL | Status: DC
  Administered 2020-08-14 – 2020-08-15 (×2): 2 via ORAL
  Filled 2020-08-13 (×2): qty 2

## 2020-08-13 MED ORDER — ACETAMINOPHEN 325 MG PO TABS
650.0000 mg | ORAL_TABLET | Freq: Four times a day (QID) | ORAL | Status: DC
  Administered 2020-08-13 – 2020-08-15 (×7): 650 mg via ORAL
  Filled 2020-08-13 (×7): qty 2

## 2020-08-13 NOTE — Discharge Summary (Signed)
Postpartum Discharge Summary     Patient Name: Meagan Vance DOB: [birth year] MRN: [id number]  Date of admission: 08/12/2020 Delivery date:08/13/2020  Delivering provider: Randa Ngo  Date of discharge: 08/15/2020  Admitting diagnosis: Post-dates pregnancy [O48.0] Intrauterine pregnancy: [redacted]w[redacted]d    Secondary diagnosis:  Principal Problem:   Vaginal delivery Active Problems:   Language barrier   GBS (group B streptococcus) UTI complicating pregnancy   Uterine fibroids affecting pregnancy, antepartum   Post-dates pregnancy  Additional problems: as noted above   Discharge diagnosis: Vaginal delivery                                            Post partum procedures:none Augmentation: Pitocin and IP Foley Complications: None  Hospital course: Onset of Labor With Vaginal Delivery      30y.o. yo G1P1001 at 458w0das admitted in Latent Labor s/p PROM at 0300 on 08/12/2020. Patient had an uncomplicated labor course as follows:  Membrane Rupture Time/Date: 3:00 AM ,08/12/2020   Delivery Method:Vaginal, Spontaneous  Episiotomy: None  Lacerations:  None  Pt received TXA and cytotec given EBL of 961 secondary to uterine atony. Patient had otherwise had an uncomplicated postpartum course.  She is ambulating, tolerating a regular diet, passing flatus, and urinating well. Patient is discharged home in stable condition on 08/15/20.  Newborn Data: Birth date:08/13/2020  Birth time:11:00 AM  Gender:Female  Living status:Living  Apgars:6 ,9  Weight:3625 g   Magnesium Sulfate received: No BMZ received: No Rhophylac:N/A MMR:N/A T-DaP:Given prenatally Flu: offered prior to discharge Transfusion:No  Physical exam  Vitals:   08/14/20 0330 08/14/20 1358 08/14/20 2053 08/15/20 0533  BP: 115/76 (!) 115/57 106/73 117/67  Pulse: 85 84 71 67  Resp: _0 Temp: 98.2 F (36.8 C) 98.5 F (36.9 C) 99.3 F (37.4 C) 98.1 F (36.7 C)  TempSrc:  Oral Oral Oral  SpO2: 100%  100%  99%  Weight:      Height:       General: alert, cooperative and no distress Lochia: appropriate Uterine Fundus: firm Incision: N/A DVT Evaluation: No evidence of DVT seen on physical exam. Labs: Lab Results  Component Value Date   WBC 19.4 (H) 08/14/2020   HGB 9.2 (L) 08/14/2020   HCT 28.0 (L) 08/14/2020   MCV 88.3 08/14/2020   PLT 161 08/14/2020   CMP Latest Ref Rng & Units 07/05/2020  Glucose 65 - 99 mg/dL 117(H)  BUN 6 - 20 mg/dL 3(L)  Creatinine 0.57 - 1.00 mg/dL 0.52(L)  Sodium 134 - 144 mmol/L 136  Potassium 3.5 - 5.2 mmol/L 3.8  Chloride 96 - 106 mmol/L 102  CO2 20 - 29 mmol/L 23  Calcium 8.7 - 10.2 mg/dL 9.3  Total Protein 6.0 - 8.5 g/dL 6.4  Total Bilirubin 0.0 - 1.2 mg/dL 0.2  Alkaline Phos 44 - 121 IU/L 91  AST 0 - 40 IU/L 16  ALT 0 - 32 IU/L 10   Edinburgh Score: Edinburgh Postnatal Depression Scale Screening Tool 08/15/2020  I have been able to laugh and see the funny side of things. 0  I have looked forward with enjoyment to things. 0  I have blamed myself unnecessarily when things went wrong. 1  I have been anxious or worried for no good reason. 0  I have felt scared or panicky for no good reason.  0  Things have been getting on top of me. 0  I have been so unhappy that I have had difficulty sleeping. 0  I have felt sad or miserable. 1  I have been so unhappy that I have been crying. 0  The thought of harming myself has occurred to me. 0  Edinburgh Postnatal Depression Scale Total 2     After visit meds:  Allergies as of 08/15/2020   No Known Allergies     Medication List    TAKE these medications   acetaminophen 325 MG tablet Commonly known as: Tylenol Take 2 tablets (650 mg total) by mouth every 6 (six) hours.   ferrous sulfate 325 (65 FE) MG tablet Commonly known as: FerrouSul Take 1 tablet (325 mg total) by mouth every other day.   ibuprofen 600 MG tablet Commonly known as: ADVIL Take 1 tablet (600 mg total) by mouth every 6 (six)  hours.   polyethylene glycol powder 17 GM/SCOOP powder Commonly known as: MiraLax Take one scoop one to two times daily for constipation   PrePLUS 27-1 MG Tabs Take 1 tablet by mouth daily.        Discharge home in stable condition Infant Feeding: breast & bottle Infant Disposition:home with mother Discharge instruction: per After Visit Summary and Postpartum booklet. Activity: Advance as tolerated. Pelvic rest for 6 weeks.  Diet: routine diet Future Appointments: Future Appointments  Date Time Provider [place]  08/18/2020  2:55 PM Megan Salon, MD [facility]   Follow up Visit: Messages sent to [facility] on 08/13/20 to schedule PP appt.  Please schedule this patient for a In person postpartum visit in 6 weeks with the following provider: Any provider. Additional Postpartum F/U: none High risk pregnancy complicated by: multiple fibroids, ascites in pregnancy, alpha-thal, tay sachs carrier  Delivery mode:  Vaginal, Spontaneous  Anticipated Birth Control:  Unsure - declined postpartum    08/15/2020 Janet Berlin, MD

## 2020-08-13 NOTE — Lactation Note (Signed)
This note was copied from a baby's chart. Lactation Consultation Note  Patient Name: Meagan Vance LOVFI'E Date: 08/13/2020 Reason for consult: Initial assessment Age:28 hours  Initial visit to 35 hours old infant of a [name]. Infant is sleeping in basinet upon arrival. Mother states infant has been sleepy and uninterested to feed. Meagan Vance unswaddled infant. Talked to mother about hand expression and demonstrated technique. Colostrum easily expressed and collected ~7mL in a spoon.  Infant started cueing. Mother prefers modified cradle position to right breast. Demonstrated alignment. Latched infant with ease. Noted suckling and swallowing.  Infant popped off breast. LC demonstrated spoon-feeding with 44mL of EBM. Assisted with latch again. Mother seems more confident. Infant sucking rhythmically with flanged lips, observed swallowing and breast tissue moving. Mother denies pain or discomfort.  Reviewed with mother average size of a NB stomach. Talked about infant's hunger and fullness cues.  Discussed milk coming to volume. Reviewed newborn behavior and expectations during first days of life.    Plan: 1-Breastfeeding on demand, ensuring a deep, comfortable latch.  2-Offer breast 8-12 times in 24h period to establish good milk supply. 3-Undressing infant and place skin to skin when ready to breastfeed 4-Keep infant awake during breastfeeding session: massaging breast, infant's hand/shoulder/feet 5-Monitor voids and stools as signs good intake.  6-Encouraged maternal rest, hydration and food intake.  7-Contact LC as needed for feeds/support/concerns/questions   All questions answered at this time. Provided Lactation services brochure and promoted [employer] booklet information.    Maternal Data Formula Feeding for Exclusion: Yes Reason for exclusion: Mother's choice to formula and breast feed on admission Has patient been taught Hand Expression?: Yes Does the patient have breastfeeding  experience prior to this delivery?: No  Feeding Feeding Type: Breast Fed  LATCH Score Latch: Repeated attempts needed to sustain latch, nipple held in mouth throughout feeding, stimulation needed to elicit sucking reflex.  Audible Swallowing: A few with stimulation  Type of Nipple: Everted at rest and after stimulation  Comfort (Breast/Nipple): Soft / non-tender  Hold (Positioning): Assistance needed to correctly position infant at breast and maintain latch.  LATCH Score: 7  Interventions Interventions: Breast feeding basics reviewed;Assisted with latch;Skin to skin;Breast massage;Hand express;Adjust position;Support pillows;Position options;Expressed milk  Lactation Tools Discussed/Used WIC Program: Yes   Consult Status Consult Status: Follow-up Date: 08/14/20 Follow-up type: In-patient    Alonso Gapinski A Higuera Ancidey 08/13/2020, 6:02 PM

## 2020-08-13 NOTE — Progress Notes (Addendum)
Patient ID: Meagan Vance, female   DOB: 06-23-1991, 30 y.o.   MRN: [id number] Labor Progress Note Meagan Vance is a 30 y.o. G1P0 at [redacted]w[redacted]d presented for SOL with ROM at 0300. S: Resting comfortably.   O:  BP (!) 104/59   Pulse 78   Temp 98.4 F (36.9 C) (Axillary)   Resp 15   Ht 4\' 11"  (1.499 m)   Wt 73 kg   LMP 11/07/2019   BMI 32.51 kg/m  EFM: baseline HR 140 bpm/mod variability/accels present, no decels Contractions every 2 minutes  CVE: Dilation: 10 Effacement (%): 90 Cervical Position: Middle Station: Plus 2 Presentation: Vertex Exam by:: Dr Astrid Drafts   A&P: 30 y.o. G1P0 [redacted]w[redacted]d here for SOL for PROM.  #Labor: PROM @ 0300 yesterday. FB out at 1515. Pitocin started at 1300, was up to 16 then reduced to 8 @445 , currently running at 16. IUPC in place. Progressing well. Fully dilated- will start practice pushing soon. #Pain: Epidural in place #FWB: Category 1 strip #GBS positive on penicillin #Uterine Fibroids: Monitored throughout pregnancy. Will continue to monitor. #Language barrier: Speaks [place] (understands most English), virtual interpretor at bedside.  Lowella Curb, Medical Student 9:43 AM

## 2020-08-13 NOTE — Anesthesia Postprocedure Evaluation (Signed)
Anesthesia Post Note  Patient: Meagan Vance  Procedure(s) Performed: AN AD HOC LABOR EPIDURAL     Patient location during evaluation: Mother Baby Anesthesia Type: Epidural Level of consciousness: awake Pain management: satisfactory to patient Vital Signs Assessment: post-procedure vital signs reviewed and stable Respiratory status: spontaneous breathing Cardiovascular status: stable Anesthetic complications: no   No complications documented.  Last Vitals:  Vitals:   08/13/20 1340 08/13/20 1500  BP: 114/60 103/67  Pulse: 71 84  Resp: 18 18  Temp: 37.3 C 36.9 C    Last Pain:  Vitals:   08/13/20 1500  TempSrc: Oral  PainSc: 0-No pain   Pain Goal:                   [employer]

## 2020-08-13 NOTE — Discharge Instructions (Signed)

## 2020-08-13 NOTE — Lactation Note (Signed)
This note was copied from a baby's chart. Lactation Consultation Note  Patient Name: Meagan Vance [id number] Date: 08/13/2020 Reason for consult: L&D Initial assessment Age:28 hours  L&D consult with 88 minutes old infant and [name]. Parents are present at time of consult. Congratulated them on their newborn. Infant is skin to skin prone on mother's chest. Discussed STS as ideal transition for infants after birth helping with temperature, blood sugar and comfort. Talked about primal reflexes such as rooting, hands to mouth, searching for the breast among others.   Assisted with positioning to latch at this time. Infant latch by himself, laid-back position to right breast. Explained [place] services availability during postpartum stay. Thanked family for their time.    Maternal Data Formula Feeding for Exclusion: Yes Reason for exclusion: Mother's choice to formula and breast feed on admission Has patient been taught Hand Expression?: No Does the patient have breastfeeding experience prior to this delivery?: No  Feeding Feeding Type: Breast Fed  LATCH Score Latch: Grasps breast easily, tongue down, lips flanged, rhythmical sucking.  Audible Swallowing: Spontaneous and intermittent  Type of Nipple: Everted at rest and after stimulation  Comfort (Breast/Nipple): Soft / non-tender  Hold (Positioning): Assistance needed to correctly position infant at breast and maintain latch.  LATCH Score: 9  Interventions Interventions: Breast feeding basics reviewed;Skin to skin;Assisted with latch  Consult Status Consult Status: Follow-up Date: 08/13/20 Follow-up type: In-patient    Jais Demir A Higuera Ancidey 08/13/2020, 11:59 AM

## 2020-08-13 NOTE — Progress Notes (Signed)
Labor Progress Note Meagan Vance is a 30 y.o. G1P0 at [redacted]w[redacted]d presented for SOL with ROM at 0300.  S: Resting comfortably   O:  BP (!) 106/51   Pulse 92   Temp 99.3 F (37.4 C) (Oral)   Resp 16   Ht 4\' 11"  (1.499 m)   Wt 73 kg   LMP 11/07/2019   BMI 32.51 kg/m  EFM: baseline HR 150 bpm/mod variability/accels present, few variable decels  Contractions every 3-4 min   CVE: Dilation: 5.5 Effacement (%): 90 Cervical Position: Middle Station: -1 Presentation: Vertex Exam by:: Pilar Jarvis, RN   A&P: 30 y.o. G1P0 [redacted]w[redacted]d here for IOL for PROM  #Labor: PROM @ 0300. FB out at 1515, pitocin started at 1300, currently running at 14   #Pain: epidural in place  #FWB: Cat 1 strip  Overall  #GBS positive on pcn  #uterine fibroids: monitored throughout pregnancy. Will continue to monitor   Janet Berlin, [place] [facility] [place] 2:39 AM

## 2020-08-14 LAB — CBC
HCT: 28 % — ABNORMAL LOW (ref 36.0–46.0)
Hemoglobin: 9.2 g/dL — ABNORMAL LOW (ref 12.0–15.0)
MCH: 29 pg (ref 26.0–34.0)
MCHC: 32.9 g/dL (ref 30.0–36.0)
MCV: 88.3 fL (ref 80.0–100.0)
Platelets: 161 10*3/uL (ref 150–400)
RBC: 3.17 MIL/uL — ABNORMAL LOW (ref 3.87–5.11)
RDW: 15.2 % (ref 11.5–15.5)
WBC: 19.4 10*3/uL — ABNORMAL HIGH (ref 4.0–10.5)
nRBC: 0 % (ref 0.0–0.2)

## 2020-08-14 MED ORDER — FERROUS SULFATE 325 (65 FE) MG PO TABS
325.0000 mg | ORAL_TABLET | ORAL | Status: DC
  Administered 2020-08-14: 325 mg via ORAL
  Filled 2020-08-14: qty 1

## 2020-08-14 NOTE — Lactation Note (Signed)
This note was copied from a baby's chart. Lactation Consultation Note  Patient Name: Boy Garlene Apperson YHOOI'L Date: 08/14/2020 Reason for consult: Follow-up assessment Age:28 hours   LC Follow Up Visit:  Attempted to visit with mother, however, she was asleep.  Will return later today.   Maternal Data    Feeding    LATCH Score                   Interventions    Lactation Tools Discussed/Used     Consult Status Consult Status: Follow-up Date: 08/14/20 Follow-up type: In-patient    Toleen Lachapelle R Zacharee Gaddie 08/14/2020, 11:32 AM

## 2020-08-14 NOTE — Lactation Note (Signed)
This note was copied from a baby's chart. Lactation Consultation Note  Patient Name: Meagan Vance Date: 08/14/2020 Reason for consult: Follow-up assessment;Mother's request;Difficult latch;Term (PIH) Age:28 hours  Infant is sleepy at the breast and last feeding was at 8 am according to RN, Windy Carina. On arrival, Mom had infant swaddled in 2 blankets and the room temp set 78. LC lowered the temp, removed the blankets and placed infant at the breasts STS. Infant nursed on both breasts in semi sitting position with signs of milk transfer with stimulation and breast compression for 22 minutes. At the end of the feeding, his hands were at his side.   Mom and Dad able to comprehend and declined interpreter use during the visit.   Mom asked how often to pump and where to store the milk she collects. LC went over the parts, assembly and cleaning of the manual pump. Mom to pump q 3 hours for 15 minutes and snappie provided for collection. Mom is aware any EBM good for 4 hours room temp, 4 days in the refrigerator and 6 months in the freezer.   Plan 1. To feed based on cues 8-12x in 24 hour period no more than 4 hours without an attempt. Mom can hand expression and spoon feed EBM if unable to get infant to latch.  Mom aware to place infant at the breast, STS and look for signs of milk transfer.           2. Mom to pump with DEBP q 3 hours for 15 minutes.           3. Mom in communication with [employer] to get a breast pump for home.     All questions answered at the end of the visit.

## 2020-08-14 NOTE — Progress Notes (Signed)
POSTPARTUM PROGRESS NOTE  Subjective: Meagan Vance is a 30 y.o. G1P1001 s/p SVD at [redacted]w[redacted]d.  She reports she doing well. No acute events overnight. She denies any problems with ambulating, voiding or po intake. Denies nausea or vomiting. She has  passed flatus. Pain is well controlled.  Lochia is moderate but decreasing.  Objective: Blood pressure 115/76, pulse 85, temperature 98.2 F (36.8 C), resp. rate 18, height 4\' 11"  (1.499 m), weight 73 kg, last menstrual period 11/07/2019, SpO2 100 %, unknown if currently breastfeeding.  Physical Exam:  General: alert, cooperative and no distress Chest: no respiratory distress Abdomen: soft, non-tender  Uterine Fundus: firm and at level of umbilicus Extremities: No calf swelling or tenderness  no edema  Recent Labs    08/12/20 1212 08/14/20 0420  HGB 11.4* 9.2*  HCT 34.4* 28.0*    Assessment/Plan: Meagan Vance is a 30 y.o. G1P1001 s/p SVD at [redacted]w[redacted]d. PPD#1  Routine Postpartum Care: Doing well, pain well-controlled.  -- Continue routine care, lactation support  -- Contraception: discussed options with patient today, currently declines. Will check back in tomorrow.  -- Feeding: breast -- Desires Circ: consented at bedside.  --Acute blood loss anemia: hgb 11.4 --> 9.2. Will start PO iron.     Dispo: Plan for discharge PPD#2.  Janet Berlin, MD OB Fellow, Faculty Practice 08/14/2020 5:51 AM

## 2020-08-15 DIAGNOSIS — D259 Leiomyoma of uterus, unspecified: Secondary | ICD-10-CM

## 2020-08-15 DIAGNOSIS — Z8759 Personal history of other complications of pregnancy, childbirth and the puerperium: Secondary | ICD-10-CM

## 2020-08-15 DIAGNOSIS — Z603 Acculturation difficulty: Secondary | ICD-10-CM

## 2020-08-15 DIAGNOSIS — O99893 Other specified diseases and conditions complicating puerperium: Secondary | ICD-10-CM

## 2020-08-15 MED ORDER — ACETAMINOPHEN 325 MG PO TABS: 650.0000 mg | ORAL_TABLET | Freq: Four times a day (QID) | ORAL | Status: AC

## 2020-08-15 MED ORDER — IBUPROFEN 600 MG PO TABS: 600.0000 mg | ORAL_TABLET | Freq: Four times a day (QID) | ORAL | 0 refills | Status: DC

## 2020-08-15 MED ORDER — POLYETHYLENE GLYCOL 3350 17 GM/SCOOP PO POWD: ORAL | 0 refills | Status: DC

## 2020-08-15 NOTE — Lactation Note (Signed)
This note was copied from a baby's chart. Lactation Consultation Note  Patient Name: Meagan Vance HGDJM'[id number] Date: 08/15/2020 Reason for consult: Follow-up assessment;Mother's request;Primapara;1st time breastfeeding;Term;Infant weight loss;Hyperbilirubinemia Age:28 hours Received call from [employer], states mom is ready to latch baby. Upon entering room mom sitting on couch holding baby, dad sitting on couch, mom states baby had wet and stool diaper prior to [employer] entering room. Baby observed with yellowing to upper and lower gums. Mom latched baby to breast, baby with tight angle, nipple nursing. LC broke latch and assisted with obtaining deeper latch, ~3 swallows noted within 5 mins followed by non-nutritive sucks. Mom switched baby to right breast, noted ~5 audible swallows followed by non-nutritive sucks, baby became fussy at breast. LC hand expressed, obtained 2 drops of colostrum. Discussed option of supplementation, mom requests EBM and formula (declined DBM). BF Supplementation sheet given, reinforced this is a temporary plan and will be reassessed in the AM, mom set up with DEBP, mom voiced understanding. Dwaine Deter, RN notified of plan and mom's request for formula. Mom asked if ok to use a pacifier, states baby is always hungry, reinforced feed on cue 8-12 feedings in 24hrs wnl, avoid pacifier x76mo unless indicated, mom voiced understanding. Left the room with mom nursing baby at right breast.  BGilliam, RN, IBCLC with Stefani Dama, S-IBCLC  Maternal Data    Feeding Feeding Type: Breast Fed  LATCH Score Latch: Grasps breast easily, tongue down, lips flanged, rhythmical sucking.  Audible Swallowing: A few with stimulation  Type of Nipple: Everted at rest and after stimulation  Comfort (Breast/Nipple): Filling, red/small blisters or bruises, mild/mod discomfort  Hold (Positioning): Assistance needed to correctly position infant at breast and maintain latch.  LATCH Score:  7  Interventions Interventions: Breast feeding basics reviewed;Assisted with latch;Skin to skin;Hand express;Breast compression;Support pillows;Adjust position;Position options;Expressed milk;Hand pump;DEBP  Lactation Tools Discussed/Used Pump Education: Setup, frequency, and cleaning;Milk Storage Initiated by:: Rex Kras, RN, IBCLC Date initiated:: 08/15/20   Consult Status Consult Status: Follow-up Date: 08/16/20 Follow-up type: In-patient    [name] 08/15/2020, 12:27 PM

## 2020-08-15 NOTE — Lactation Note (Signed)
This note was copied from a baby's chart. Lactation Consultation Note  Patient Name: Meagan Vance IRWER'X Date: 08/15/2020 Reason for consult: Follow-up assessment;1st time breastfeeding;Primapara;Term Age:28 hours Language Line used for visit: [place] interpreter: Leroy Libman #[id number] Mom sitting in bed, dad asleep on couch, baby asleep on back in bassinet. Mom states last feeding was at 8am, baby fed for 60mins, states baby falls asleep at times during feedings. Mom states breasts feel soft, denies noting firmness, heaviness or discomfort. Reviewed number of wet and stools on Flowsheet, mom states baby has had a urine diaper which was changed by the RN (per Meagan Deter, RN this may have been changed by the nurse tech during the AM weight and bili check).   Demostrated how to use hand pump as well as hand expression. Discussed engorgement and how to manage, feed baby on cue, wake if >3hrs since last feeding. Mom aware to call LC to observe next feeding to assess latch and transfer of colostrum, Meagan Deter, RN notified of plan. Meagan Kras, RN, IBCLC with Meagan Vance, S-IBCLC    Interventions Interventions: Breast feeding basics reviewed;Hand pump  Lactation Tools Discussed/Used     Consult Status Consult Status: Follow-up Date: 08/15/20 Follow-up type: In-patient    Meagan Vance 08/15/2020, 9:39 AM

## 2020-08-15 NOTE — Progress Notes (Signed)
Interpreter Meagan Vance [phone]

## 2020-08-16 ENCOUNTER — Ambulatory Visit: Payer: Self-pay

## 2020-08-16 NOTE — Lactation Note (Signed)
This note was copied from a baby's chart. Lactation Consultation Note  Patient Name: Boy Nazyia Gaugh [id number] Date: 08/16/2020 Reason for consult: Follow-up assessment Age:28 hours   [place] Interpreter Viann Shove ID # [id number] on line for all teaching with parents.   When [facility] arrived in the room. Mother had infant in cradle position having difficulty latch infant.  Basic teaching on latching infant. Infant was placed on support pillows and pillow to mothers back. Taught where mother could place her hands on infants head as to not hurt infant.  Infant latched on , adjusted lips for wider gape. Taught mother that infant would transfer more milk with his mouth wide open.   Encouraged  Mother to support infant holding him close.   Mother was observed with infant latched on at the Rt breast. Observed infant suckling with audible swallows. Infant sustained latch for 20 mins.   Discussed treatment and prevention of engorgement.   Mother ask when she could start giving formula to her infant. Lots of teaching on differences in breastmilk and formula. Suggested that she follow WHO breastfeeding standards giving breastmilk for one year.  Parents listened intently and thanked me for all the teaching.   Mother to continue to cue base feed infant and feed at least 8-12 times or more in 24 hours and advised to allow for cluster feeding infant as needed.  Mother to continue to due STS. Mother is aware of available LC services at [facility], BFSG'S, OP Dept, and phone # for questions or concerns about breastfeeding.  Mother receptive to all teaching and plan of care.    Maternal Data    Feeding Feeding Type: Breast Fed  LATCH Score Latch: Grasps breast easily, tongue down, lips flanged, rhythmical sucking.  Audible Swallowing: Spontaneous and intermittent  Type of Nipple: Everted at rest and after stimulation  Comfort (Breast/Nipple): Filling, red/small blisters or bruises, mild/mod  discomfort  Hold (Positioning): Assistance needed to correctly position infant at breast and maintain latch.  LATCH Score: 8  Interventions Interventions: Adjust position;Support pillows;Position options;Hand pump;Breast massage;Skin to skin;Assisted with latch;Breast feeding basics reviewed  Lactation Tools Discussed/Used     Consult Status Consult Status: Complete    Darla Lesches 08/16/2020, 12:24 PM

## 2020-08-18 ENCOUNTER — Encounter: Payer: Medicaid Other | Admitting: Obstetrics & Gynecology

## 2020-08-20 ENCOUNTER — Inpatient Hospital Stay (HOSPITAL_COMMUNITY)
Admission: AD | Admit: 2020-08-20 | Payer: Medicaid Other | Source: Home / Self Care | Admitting: Obstetrics and Gynecology

## 2020-08-20 ENCOUNTER — Inpatient Hospital Stay (HOSPITAL_COMMUNITY): Payer: Medicaid Other

## 2020-08-24 ENCOUNTER — Telehealth: Payer: Self-pay

## 2020-08-24 NOTE — Telephone Encounter (Signed)
Received a call from Gala Lewandowsky, Nurse Care Manager from [place] at [facility] was informed that she was a doing an assessment and that she is taking Tylenol and Ibuprofen and she wanted to ask about it.    Called Cleo and she informed me that the pt was instructed to take Tylenol every 6 hours and ibuprofen every 6 hours.  Cleo stated that she has told the patient to only take for pain as needed.  I informed Cleo that I would document what she has informed me and that we will f/u with her at her appt.  Cleo agreed with no further concerns.   Frances Nickels  08/24/20

## 2020-09-28 ENCOUNTER — Ambulatory Visit (INDEPENDENT_AMBULATORY_CARE_PROVIDER_SITE_OTHER): Payer: Medicaid Other | Admitting: Family Medicine

## 2020-09-28 ENCOUNTER — Other Ambulatory Visit (HOSPITAL_COMMUNITY)
Admission: RE | Admit: 2020-09-28 | Discharge: 2020-09-28 | Disposition: A | Discharge status: Home / Self Care | Payer: Medicaid Other | Source: Ambulatory Visit | Attending: Family Medicine | Admitting: Family Medicine

## 2020-09-28 ENCOUNTER — Encounter: Payer: Self-pay | Admitting: Family Medicine

## 2020-09-28 ENCOUNTER — Other Ambulatory Visit: Payer: Self-pay

## 2020-09-28 DIAGNOSIS — Z124 Encounter for screening for malignant neoplasm of cervix: Secondary | ICD-10-CM | POA: Insufficient documentation

## 2020-09-28 MED ORDER — POLYETHYLENE GLYCOL 3350 17 GM/SCOOP PO POWD
ORAL | 1 refills | Status: DC
Start: 2020-09-28 — End: 2022-09-05

## 2020-09-28 NOTE — Patient Instructions (Signed)
[place] [phone]) . [place] o [address] [place], [place] [address] o [phone] o Mon-Fri 8:30-12:30, 1:30-5:00 o Accepting Medicaid . [place] at [facility] [place], [place], [place] [address] o [phone] o Mon-Fri 8:00-5:30 . [place] [place], [place] [address] o [phone], Tue, Thur, Fri 8:30-5:00, Wed 10:00-7:00 (closed 1-2pm) o Accepting Medicaid . [facility] o [address], [place], [place]  [address] o Phone - [phone]   Fax - [phone]  [place] [phone]) . [place] [place], [place] [address] o [phone] o Mon-Fri 8:00-5:00 . [facility] at [facility])  o [address] [place], [place] [address] o [phone] o Mon-Fri 8:30-5:30, Sat (Oct.-Mar.) 9:00-1:00 o Accepting [facility] [phone]) . [place] at [place], [place], [place] [address] o [phone] o Mon-Fri 8:00-5:00  [place] [phone]) . [place] at [place], [place], [place] [address] o [phone] o Mon-Fri 8:00-5:00 . Therapist, music at [place], [place], [place] [address] o [phone] o Mon-Fri 8:00-5:00 . Therapist, music at [place] [place], [place] [address] o [phone] o Mon-Fri 8:00-5:00 . [place] [place] [address] o [phone] o Mon-Fri 7:30-5:30  [place] ([place]) . [place] [place], [place] [address] o [phone] o Mon-Thur 8:00-6:00 o Accepting Medicaid . [employer] [place], [place] [address] o [phone] o Mon-Thur 7:30-7:30, Fri 7:30-4:30 o Accepting Medicaid . [place] at [place] [address], [place], [place]  [address] o [phone]   Fax - [place] [phone] & [phone]) . Therapist, music at [place]., [place], [place] [address] o [phone] o Mon-Fri 7:00-5:00 . [place], [place], [place] [address] o [phone] o Mon-Fri 8:00-5:00 o Accepting Medicaid . [place], [place], [place] [address] o [phone] o Mon-Fri 8:00-5:00 o Accepting Medicaid  [facility] [place] [phone]) . [facility] o [place] [place], [place] [address] o [phone] o Mon-Fri 8:00-5:00 . [place] ([place] at [employer]) o [address] [place], [place], [place] [address] o [phone] o Mon-Fri 8:00-5:00 o Accepting Medicaid . [place] ([facility] [employer]) o [address] [place], [place], [place] [address] o [phone] o Mon-Fri 8:00-5:30, Sat&Sun by appointment (phones open at 8:30) o Accepting [facility] [phone] & [phone]) . [facility] o [address] [place], [place] [address] o [phone] o Mon-Thur 8:00-7:00, Fri 8:00-5:00, Sat 8:00-12:00, Sun 9:00-12:00 o Accepting Medicaid . [facility] at [facility] [address], [place], [place] [address] o [phone] o Mon-Thur 8:00-5:00 o Accepting Medicaid . [facility] at [place]., [place],  [address] o [phone] o Mon-Fri 8:00-5:30, Sat (Oct.-Mar.) 9:00-1:00 o Accepting [employer]  [phone]) .  [place] o [address] [place], [place], [place] [address] o [phone] o Mon-Fri 8:00-5:00 o Accepting Medicaid   [facility] [phone]) . [place] at [place], [place], [place] [address] o [phone] o Mon-Fri 8:00-5:00 . Therapist, music at [facility] o [address], [place], [place] [address] o [phone] o Mon-Fri 8:00-5:00 . [place] [address], [place], [place] [address] o [phone] o Mon-Fri 8:00-5:00 o After hours clinic [facility] [place], [place] [address]) [phone] Mon-Fri 5:00-8:00, Sat 12:00-6:00, Sun 10:00-4:00 o Accepting Medicaid . [place] at [facility] o [address], [place], [place]  [address] o [phone]   Fax - [phone]  [place] [phone]) . Therapist, music at [place] o [address], [place], [place] [address] o [phone] o Mon-Fri 8:00-5:00 . [place] [facility] [place]) o [place] [address] [place], [place],  [address] o [phone] o Mon-Thur 8:00-7:00, Fri 8:00-5:00, Sat 8:00-12:00

## 2020-09-28 NOTE — Progress Notes (Signed)
Meagan Vance Visit Note  Meagan Vance is a 30 y.o. G54P1001 female who presents for a postpartum visit. She is 6 weeks postpartum following a normal spontaneous vaginal delivery.  I have fully reviewed the prenatal and intrapartum course. The delivery was at 40 gestational weeks.  Anesthesia: epidural. Postpartum course has been uneventful. Baby is doing well. Baby is feeding by both breast and bottle - Meagan Vance Start. Bleeding staining only. Bowel function is normal. Bladder function is normal. Patient is not sexually active. Contraception method is condoms. Postpartum depression screening: negative.   The pregnancy intention screening data noted above was reviewed. Potential methods of contraception were discussed. The patient elected to proceed with Female Condom.    Edinburgh Postnatal Depression Scale - 09/28/20 1034      Edinburgh Postnatal Depression Scale:  In the Past 7 Days   I have been able to laugh and see the funny side of things. 0    I have looked forward with enjoyment to things. 0    I have blamed myself unnecessarily when things went wrong. 0    I have been anxious or worried for no good reason. 0    I have felt scared or panicky for no good reason. 0    Things have been getting on top of me. 0    I have been so unhappy that I have had difficulty sleeping. 2    I have felt sad or miserable. 0    I have been so unhappy that I have been crying. 1    The thought of harming myself has occurred to me. 0    Edinburgh Postnatal Depression Scale Total 3            The following portions of the patient's history were reviewed and updated as appropriate: allergies, current medications, past family history, past medical history, past social history, past surgical history and problem list.  Review of Systems Pertinent items noted in HPI and remainder of comprehensive ROS otherwise negative.    Objective:  BP 106/69 (BP Location: Right Arm, Patient Position: Sitting,  Cuff Size: Normal)   Pulse 78   Ht 4\' 11"  (1.499 m)   Wt 132 lb (59.9 kg)   Breastfeeding Yes   BMI 26.66 kg/m    General:  alert, cooperative and appears stated age  Lungs: comfortable on room air   Vulva:  normal  Vagina: normal vagina, no discharge, exudate, lesion, or erythema  Cervix:  no lesions  Corpus: not examined  Adnexa:  not evaluated  Rectal Exam: Not performed.        Assessment:    Normal postpartum exam. Pap smear done at today's visit.   Plan:   Essential components of care per ACOG recommendations:  1.  Mood and well being: Patient with negative depression screening today. Reviewed local resources for support.  - Patient does not use tobacco.  - hx of drug use? No    2. Infant care and feeding:  -Patient currently breastmilk feeding? Yes If breastmilk feeding discussed return to work and pumping. If needed, patient was provided letter for work to allow for every 2-3 hr pumping breaks, and to be granted a private location to express breastmilk and refrigerated area to store breastmilk. Reviewed importance of draining breast regularly to support lactation. -Social determinants of health (SDOH) reviewed in EPIC. No concerns  3. Sexuality, contraception and birth spacing - Patient does not want a pregnancy in the next year.  Desired family size is unsure. - Reviewed forms of contraception in tiered fashion. Patient desired condoms today.   - Discussed birth spacing of 18 months  4. Sleep and fatigue -Encouraged family/partner/community support of 4 hrs of uninterrupted sleep to help with mood and fatigue  5. Physical Recovery  - Discussed patients delivery and complications - Patient had no lacerations - Patient has urinary incontinence? No - Patient is safe to resume physical and sexual activity  6.  Health Maintenance - Last pap smear done unknown number of years ago, pap collected today - Mammogram: n/a  7. Chronic Disease - PCP follow  up  Clarnce Flock, MD Center for [place], [place]

## 2020-09-29 ENCOUNTER — Telehealth: Payer: Self-pay

## 2020-09-29 LAB — CYTOLOGY - PAP
Chlamydia: NEGATIVE
Comment: NEGATIVE
Comment: NEGATIVE
Comment: NORMAL
Diagnosis: NEGATIVE
Neisseria Gonorrhea: NEGATIVE
Trichomonas: NEGATIVE

## 2020-09-29 NOTE — Telephone Encounter (Signed)
Called Pt with [place] id# [phone] to advise of Pap results, explained came back normal & she to get another one in 3 years. Pt verbalized understanding.

## 2020-09-29 NOTE — Telephone Encounter (Signed)
-----   Message from Clarnce Flock, MD sent at 09/29/2020  1:34 PM EDT ----- Please call patient w [place] interpreter and let her know that her pap smear was normal, let her know she can repeat in 3 years given no hx on file of prior screens/insufficient data for this population

## 2020-11-03 ENCOUNTER — Encounter: Payer: Self-pay | Admitting: General Practice

## 2021-07-26 ENCOUNTER — Other Ambulatory Visit: Payer: Self-pay

## 2021-07-26 ENCOUNTER — Encounter (HOSPITAL_BASED_OUTPATIENT_CLINIC_OR_DEPARTMENT_OTHER): Payer: Self-pay | Admitting: Emergency Medicine

## 2021-07-26 ENCOUNTER — Emergency Department (HOSPITAL_BASED_OUTPATIENT_CLINIC_OR_DEPARTMENT_OTHER)
Admission: EM | Admit: 2021-07-26 | Discharge: 2021-07-26 | Disposition: A | Discharge status: Home / Self Care | Payer: Medicaid Other | Attending: Emergency Medicine | Admitting: Emergency Medicine

## 2021-07-26 DIAGNOSIS — S81812A Laceration without foreign body, left lower leg, initial encounter: Secondary | ICD-10-CM | POA: Diagnosis not present

## 2021-07-26 DIAGNOSIS — W25XXXA Contact with sharp glass, initial encounter: Secondary | ICD-10-CM | POA: Diagnosis not present

## 2021-07-26 MED ORDER — TETANUS-DIPHTH-ACELL PERTUSSIS 5-2.5-18.5 LF-MCG/0.5 IM SUSY
0.5000 mL | PREFILLED_SYRINGE | Freq: Once | INTRAMUSCULAR | Status: DC
  Filled 2021-07-26: qty 0.5

## 2021-07-26 NOTE — ED Notes (Signed)
Patient verbalizes understanding of discharge instructions. Opportunity for questioning and answers were provided. Patient discharged from ED.  °

## 2021-07-26 NOTE — ED Provider Notes (Signed)
[facility] EMERGENCY DEPT Provider Note   CSN: [id number] Arrival date & time: 07/26/21  1414     History  Chief Complaint  Patient presents with   Extremity Laceration    Meagan Vance is a 31 y.o. female.  She presents in the emergency department after sustaining a laceration to her left shin from a piece of glass.  This happened earlier today.  Denies feelings of foreign body still in wound.  HPI     Home Medications Prior to Admission medications   Medication Sig Start Date End Date Taking? Authorizing Provider  acetaminophen (TYLENOL) 325 MG tablet Take 2 tablets (650 mg total) by mouth every 6 (six) hours. 08/15/20   Janet Berlin, MD  ferrous sulfate (FERROUSUL) 325 (65 FE) MG tablet Take 1 tablet (325 mg total) by mouth every other day. 02/27/20   [name], CNM  ibuprofen (ADVIL) 600 MG tablet Take 1 tablet (600 mg total) by mouth every 6 (six) hours. 08/15/20   Janet Berlin, MD  polyethylene glycol powder [facility]) 17 GM/SCOOP powder Take one scoop one to two times daily for constipation 09/28/20   Clarnce Flock, MD  Prenatal Vit-Fe Fumarate-FA (PREPLUS) 27-1 MG TABS Take 1 tablet by mouth daily. 05/25/20   Janet Berlin, MD      Allergies    Patient has no known allergies.    Review of Systems   Review of Systems  Skin:  Positive for wound.  All other systems reviewed and are negative.  Physical Exam Updated Vital Signs BP 106/71 (BP Location: Left Arm)    Pulse 87    Temp 97.9 F (36.6 C) (Temporal)    Resp 18    Ht 4\' 11"  (1.499 m)    Wt 60 kg    SpO2 99%    Breastfeeding Yes    BMI 26.72 kg/m  Physical Exam Vitals and nursing note reviewed.  Constitutional:      General: She is not in acute distress.    Appearance: She is not toxic-appearing.  HENT:     Head: Normocephalic and atraumatic.  Eyes:     General: No scleral icterus.       Right eye: No discharge.        Left eye: No discharge.  Cardiovascular:      Comments: Pedal pulses 2+ bilaterally. Pulmonary:     Effort: No respiratory distress.  Skin:    Findings: Abrasion and laceration present.          Comments: There is a abrasion which is indicated but #2 above.  This is well approximated and appears clean.  No evidence of foreign body.  It is very superficial.  On the anterior marker, #1, this is a small half centimeter laceration.  It again looks superficial.  There is some macerated tissue that appears dead.  It is mostly well approximated.  Very superficial as well.  Neurological:     Mental Status: She is alert.     Comments: Lower extremity sensation grossly intact  Psychiatric:        Mood and Affect: Mood normal.        Behavior: Behavior normal.    ED Results / Procedures / Treatments   Labs (all labs ordered are listed, but only abnormal results are displayed) Labs Reviewed - No data to display  EKG None  Radiology No results found.  Procedures .Marland KitchenLaceration Repair  Date/Time: 07/26/2021 4:28 PM Performed by: Adolphus Birchwood, PA-C Authorized  by: Adolphus Birchwood, PA-C   Consent:    Consent obtained:  Verbal   Consent given by:  Patient   Risks, benefits, and alternatives were discussed: yes     Risks discussed:  Infection, pain, retained foreign body, tendon damage, need for additional repair, nerve damage, poor wound healing, vascular damage and poor cosmetic result   Alternatives discussed:  No treatment, delayed treatment and observation Universal protocol:    Procedure explained and questions answered to patient or proxy's satisfaction: yes     Patient identity confirmed:  Verbally with patient Anesthesia:    Anesthesia method:  None Laceration details:    Location:  Leg   Leg location:  L lower leg   Length (cm):  0.5 Exploration:    Wound exploration: entire depth of wound visualized   Treatment:    Area cleansed with:  Saline   Amount of cleaning:  Standard   Irrigation solution:  Sterile  saline   Irrigation method:  Pressure wash Skin repair:    Repair method:  Tissue adhesive Repair type:    Repair type:  Simple Post-procedure details:    Dressing:  Non-adherent dressing   Medications Ordered in ED Medications - No data to display   ED Course/ Medical Decision Making/ A&P                           Medical Decision Making Problems Addressed: Laceration of left lower extremity, initial encounter: self-limited or minor problem  Amount and/or Complexity of Data Reviewed External Data Reviewed: notes.    Details: Last tetanus given in 2021   This is a 31 y.o. female who presents to the ED with 2 lacerations to her left shin after a mirror fell ultimately cutting the patient's leg.  The first 1 appears to be an abrasion that is well approximated and superficial.  The second wound is inferior to this on the shin and it is about half a centimeter.  There are some macerated tissue evident, however is mostly well approximated.  This is very superficial and I do not visualize any foreign body in this wound. On chart review, found that last tetanus was given in 2021. No need to repeat. Will cleanse the wounds and try skin adhesive for closure.  Procedure tolerated without difficulty.  Home care and return precautions provided.  Portions of this note were generated with [employer]. Dictation errors may occur despite best attempts at proofreading.  Final Clinical Impression(s) / ED Diagnoses Final diagnoses:  Laceration of left lower extremity, initial encounter    Rx / DC Orders ED Discharge Orders     None         Adolphus Birchwood, PA-C 07/26/21 1629    Dorie Rank, MD 07/27/21 [phone]

## 2021-07-26 NOTE — Discharge Instructions (Signed)
You have been seen in the ED today after a laceration. We were able to repair the area using skin glue. You can wash the area gently with warm water and soap. Please return if you develop symptoms concerning for infection such as redness surrounding the wound or abnormal discharge from the wound.

## 2021-07-26 NOTE — ED Triage Notes (Signed)
Pt arrives to ED with c/o left leg laceration. This occurred after a mirror fell cutting the pts leg.

## 2022-03-02 LAB — OB RESULTS CONSOLE HEPATITIS B SURFACE ANTIGEN: Hepatitis B Surface Ag: NEGATIVE

## 2022-03-02 LAB — OB RESULTS CONSOLE ABO/RH: RH Type: POSITIVE

## 2022-03-02 LAB — OB RESULTS CONSOLE HIV ANTIBODY (ROUTINE TESTING): HIV: NONREACTIVE

## 2022-03-02 LAB — OB RESULTS CONSOLE RPR: RPR: NONREACTIVE

## 2022-03-02 LAB — OB RESULTS CONSOLE RUBELLA ANTIBODY, IGM: Rubella: IMMUNE

## 2022-03-02 LAB — HEPATITIS C ANTIBODY: HCV Ab: NEGATIVE

## 2022-03-02 LAB — OB RESULTS CONSOLE GC/CHLAMYDIA
Chlamydia: NEGATIVE
Neisseria Gonorrhea: NEGATIVE

## 2022-03-02 LAB — OB RESULTS CONSOLE ANTIBODY SCREEN: Antibody Screen: NEGATIVE

## 2022-03-04 IMAGING — US US MFM OB FOLLOW-UP
1 series · 16 of 28 positions shown · non-contrast
Comparison: none

[Series 1: us mfm ob follow-up · 44 acquisitions, 16 frames shown]
[im 1/44]
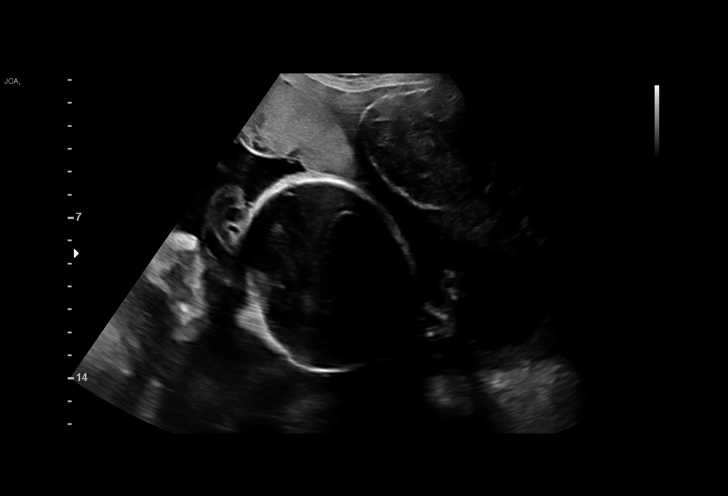
[im 4/44]
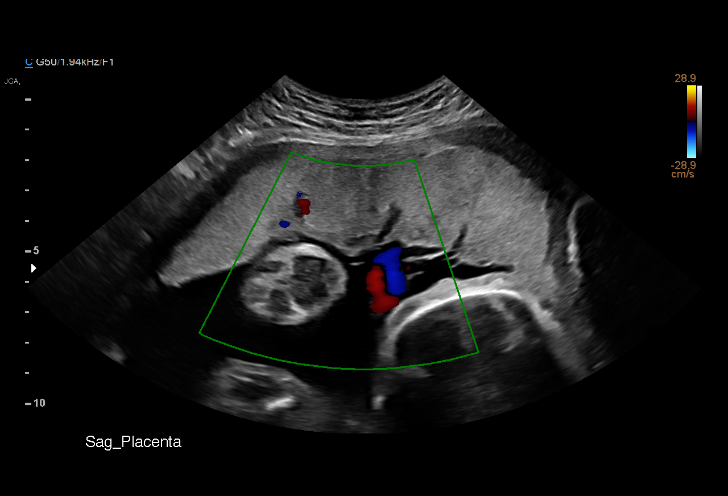
[im 7/44]
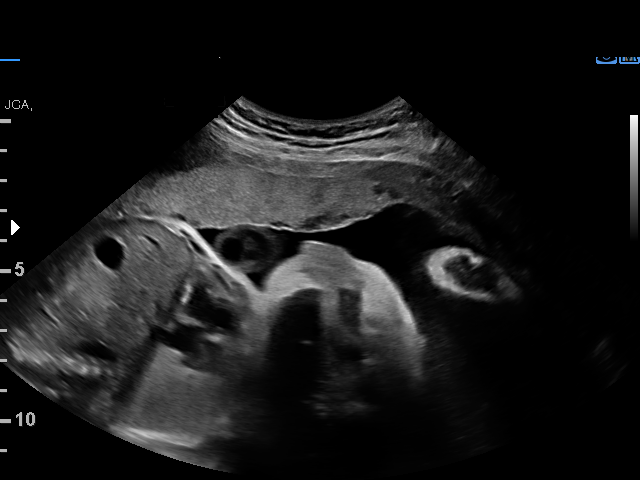
[im 10/44]
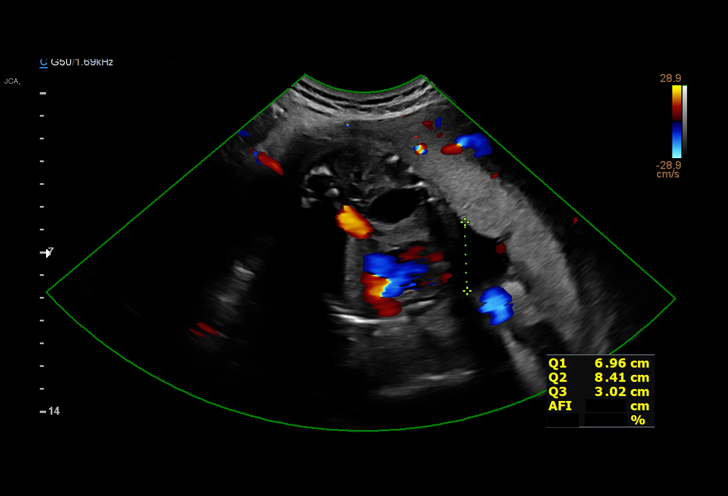
[im 12/44]
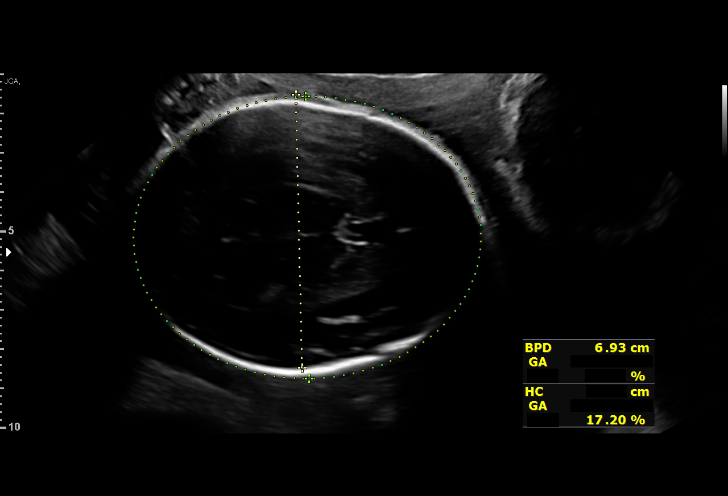
[im 15/44]
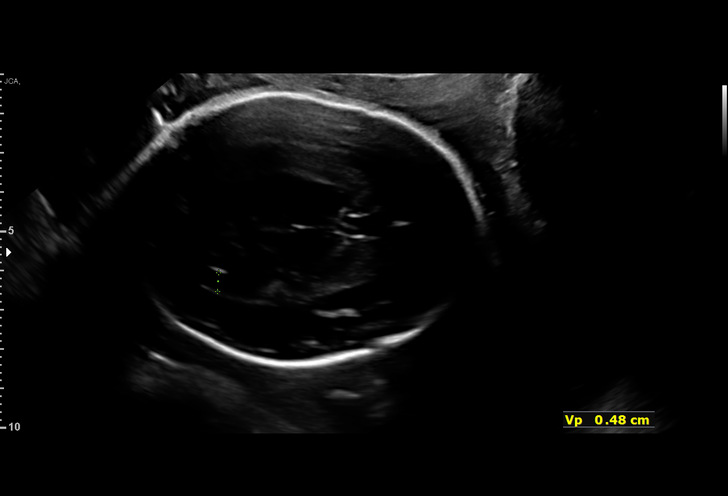
[im 18/44]
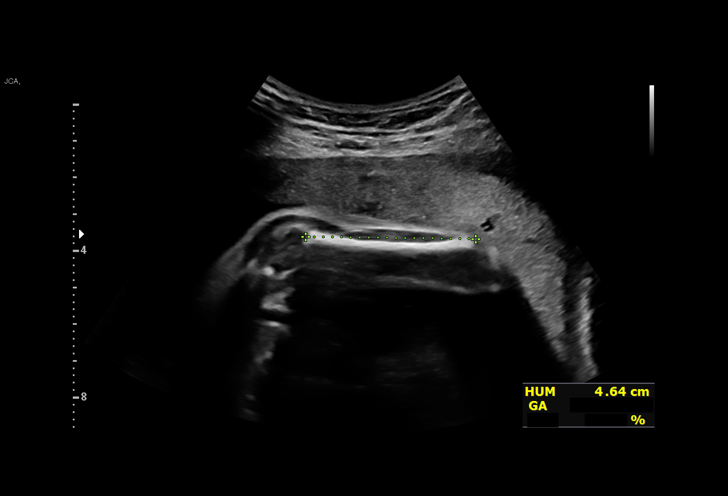
[im 21/44]
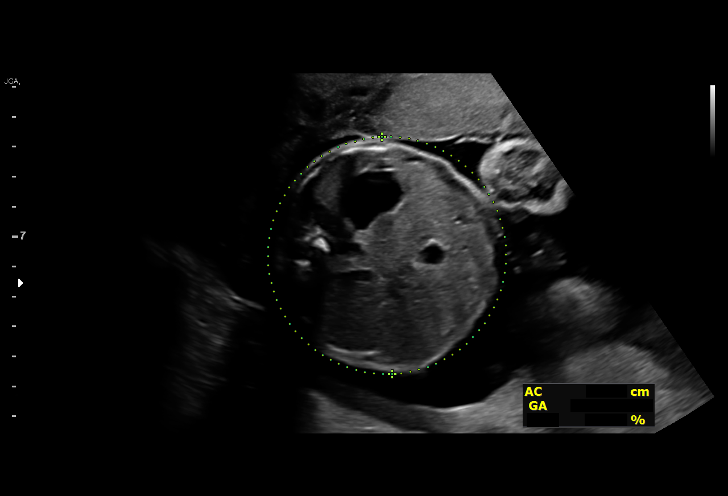
[im 23/44]
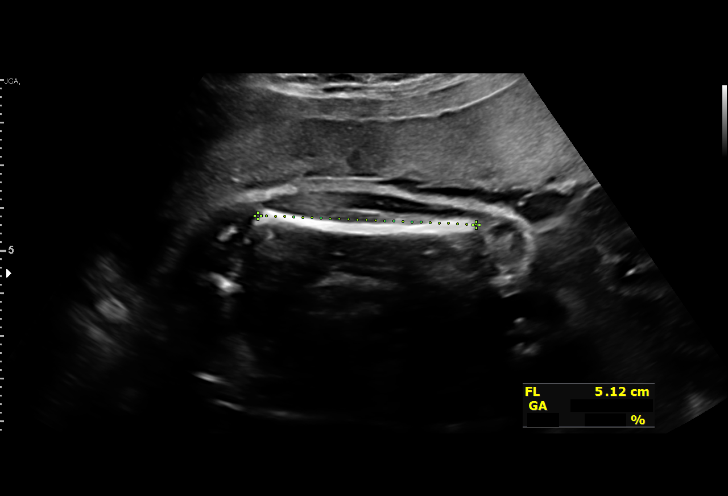
[im 26/44]
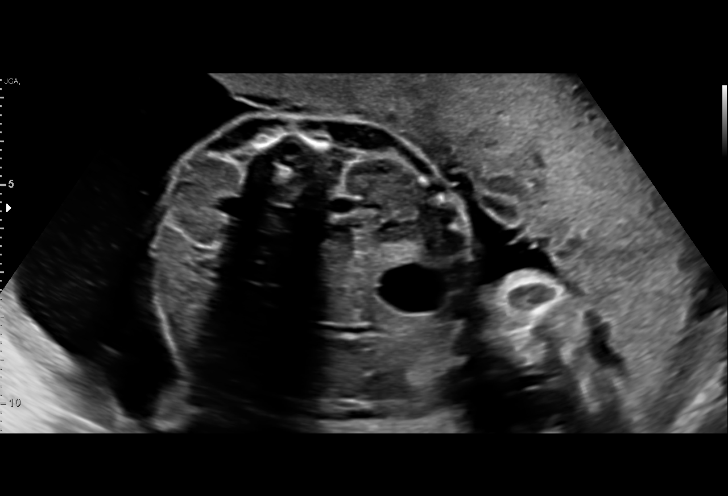
[im 29/44]
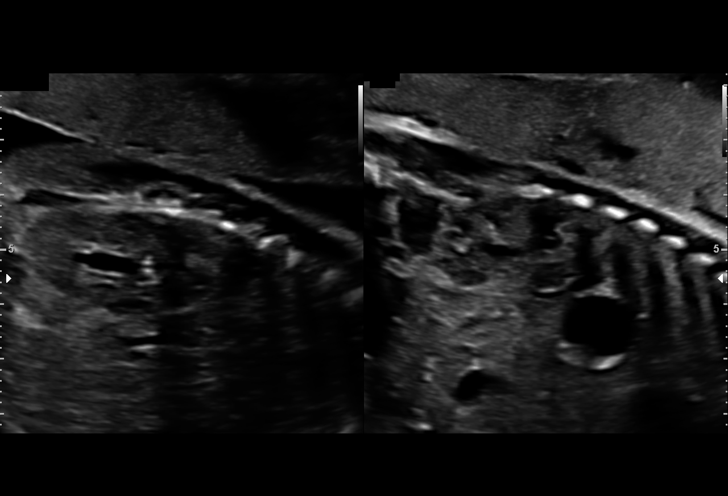
[im 32/44]
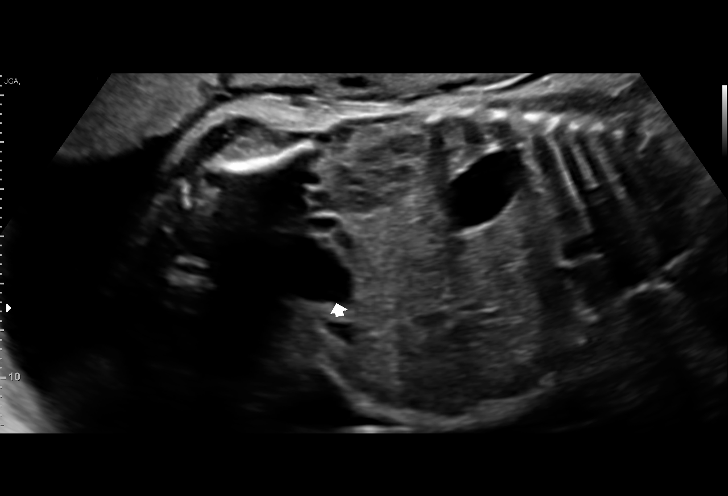
[im 34/44]
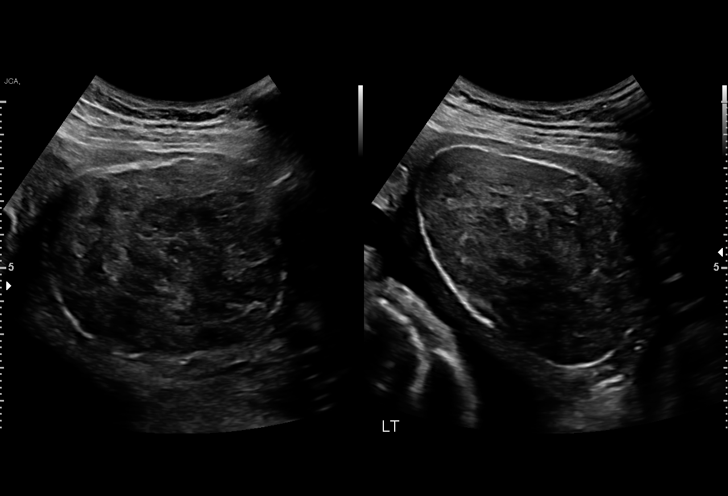
[im 37/44]
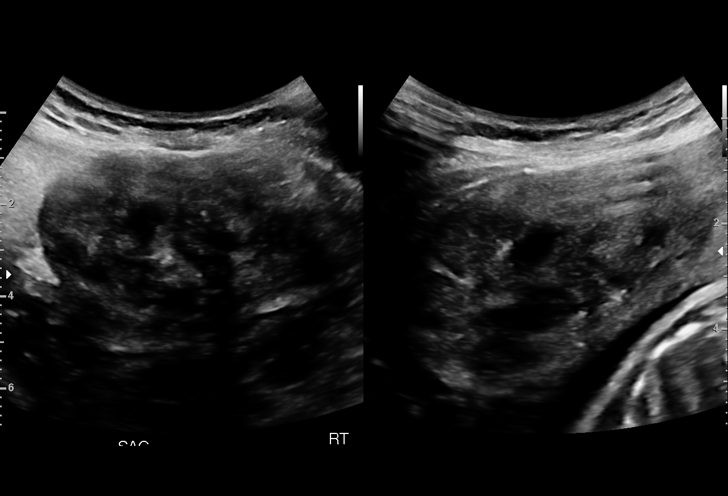
[im 40/44]
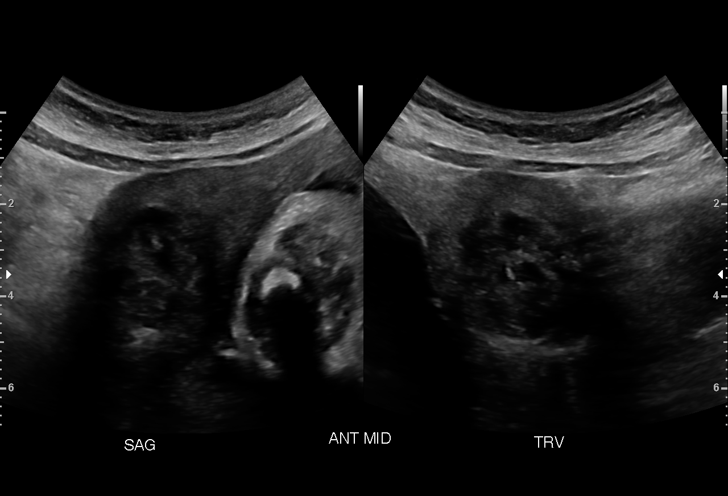
[im 44/44]
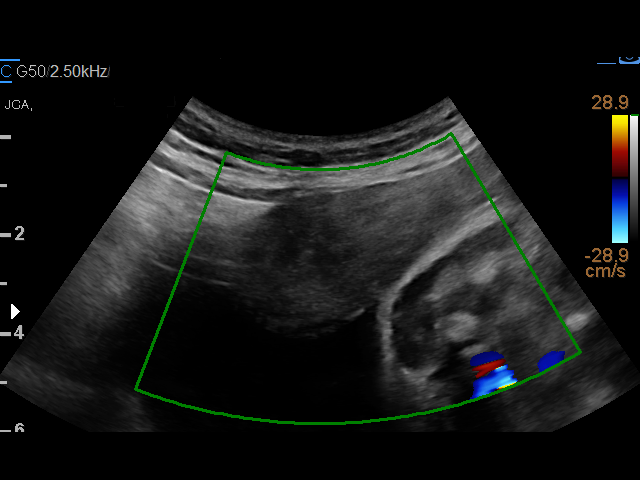

[16 of 28 positions shown; findings below may reference images not displayed]

CNM

Indications

 Fetal abnormality - other known or
 suspected (Rt UTD

## 2022-03-14 IMAGING — MR MR ABDOMEN W/O CM
16 of 18 series · 42 of 48 positions shown · non-contrast
Comparison: None.

CLINICAL DATA: Right lower quadrant abdominal pain, possible
appendicitis. Third trimester pregnancy.

EXAM:
MRI ABDOMEN AND PELVIS WITHOUT CONTRAST
TECHNIQUE: Multiplanar multisequence MR imaging of the abdomen and pelvis was
performed. No intravenous contrast was administered.

[Series 4: cor haste abd · coronal · 5.0mm · 1.02mm/px · 2 of 34 slices shown]
[im 1/34]
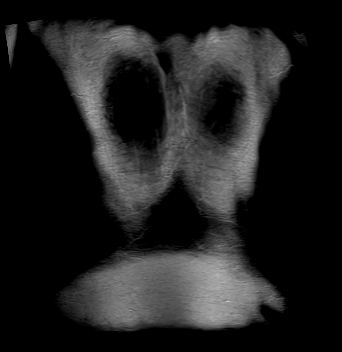
[im 34/34]
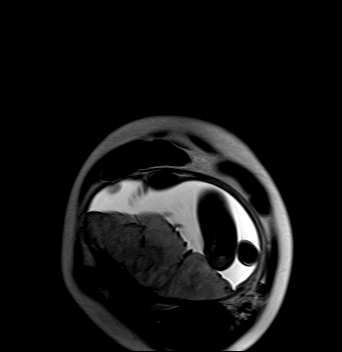

[Series 5: cor haste pelvis · coronal · 4.0mm · 1.02mm/px · 2 of 38 slices shown]
[im 1/38]
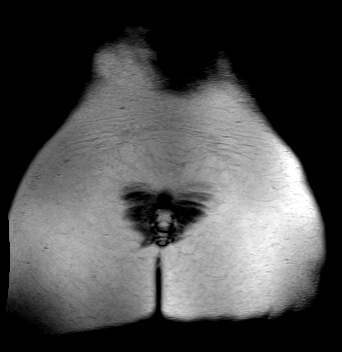
[im 38/38]
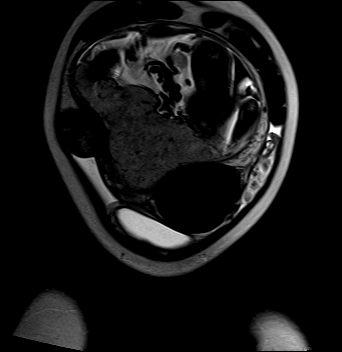

[Series 8: cor haste fs · coronal · 5.0mm · 1.02mm/px · 2 of 34 slices shown (1 of 2)]
[im 1/34]
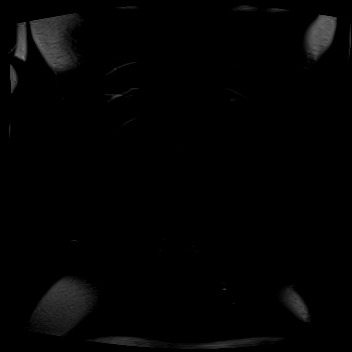
[im 34/34]
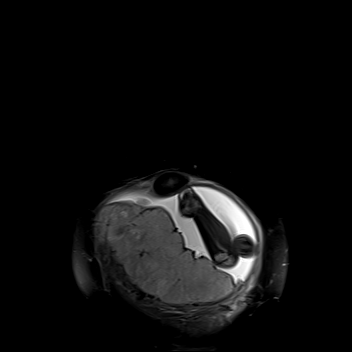

[Series 12: cor haste fs · coronal · 4.0mm · 1.02mm/px · 2 of 38 slices shown (2 of 2)]
[im 1/38]
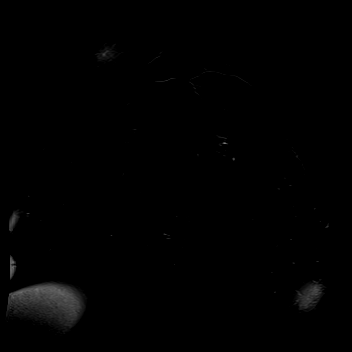
[im 38/38]
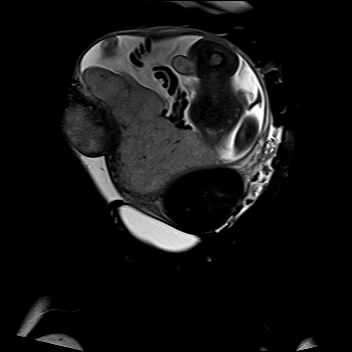

[Series 13: bSSFP · coronal · 5.0mm · 1.61mm/px · 2 of 34 slices shown (1 of 4)]
[im 1/34]
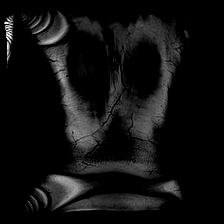
[im 34/34]
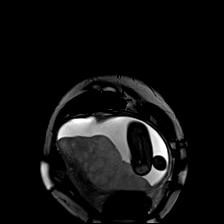

[Series 14: bSSFP · coronal · 4.0mm · 1.61mm/px · 2 of 38 slices shown (2 of 4)]
[im 1/38]
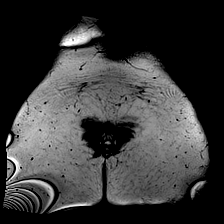
[im 38/38]
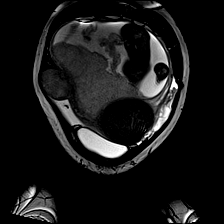

[Series 15: T2 · sagittal · 4.0mm · 0.75mm/px · 2 of 45 slices shown]
[im 1/45]
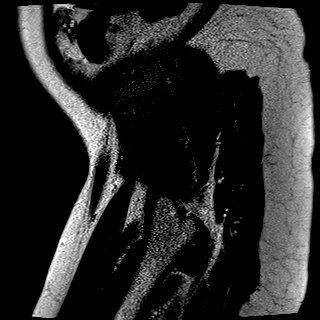
[im 45/45]
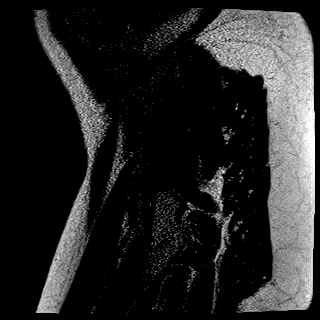

[Series 20: ax haste_comp · axial · 5.0mm · 0.99mm/px · z∈[-96,+120]mm · 2 of 37 slices shown (1 of 2)]
[im 1/37]
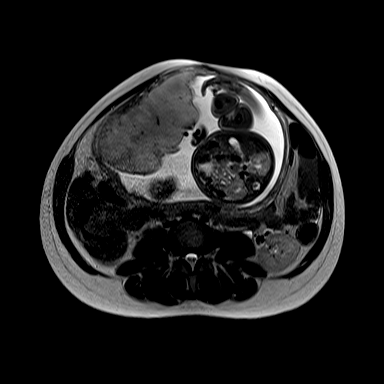
[im 37/37]
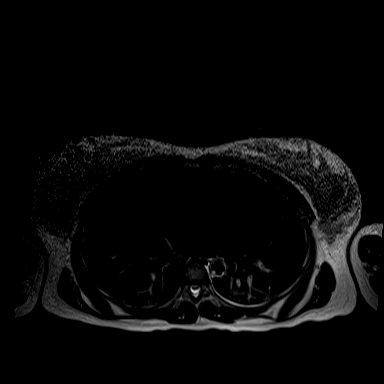

[Series 20: ax haste_comp · axial · 4.0mm · 1.19mm/px · z∈[-336,-101]mm · 3 of 50 slices shown (2 of 2)]
[im 1/50]
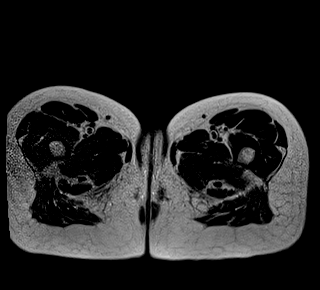
[im 25/50]
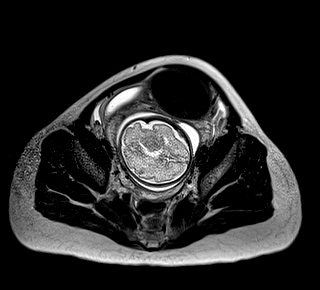
[im 50/50]
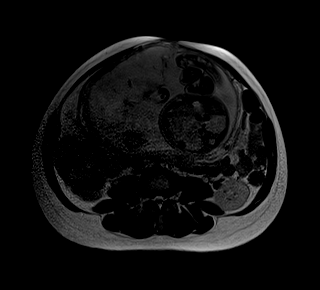

[Series 25: ax haste fs_comp · axial · 4.0mm · 1.19mm/px · z∈[-336,-101]mm · 3 of 50 slices shown (1 of 2)]
[im 1/50]
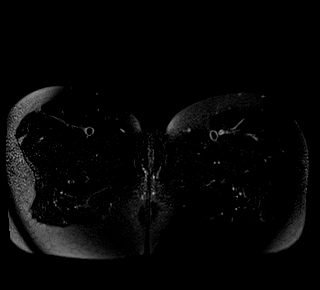
[im 25/50]
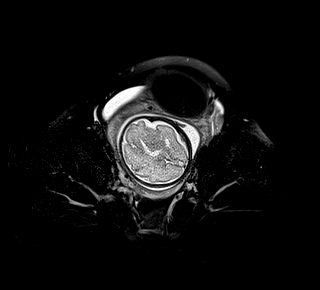
[im 50/50]
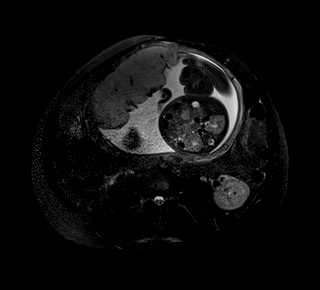

[Series 25: ax haste fs_comp · axial · 5.0mm · 0.99mm/px · z∈[-96,+120]mm · 2 of 37 slices shown (2 of 2)]
[im 1/37]
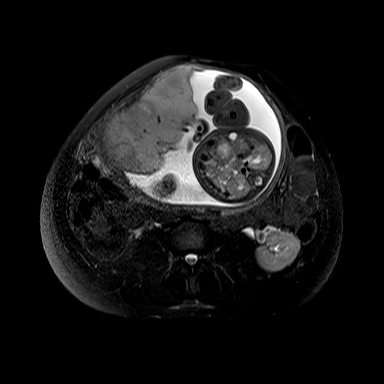
[im 37/37]
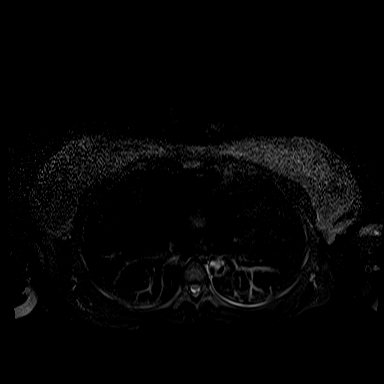

[Series 32: T2 fat-sat · axial · 4.0mm · 1.19mm/px · z∈[-336,+120]mm · 5 of 87 slices shown]
[im 1/87]
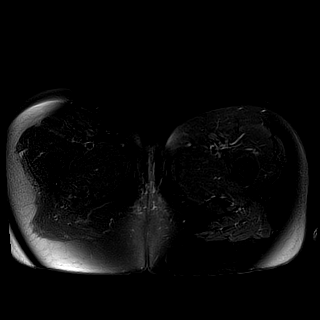
[im 22/87]
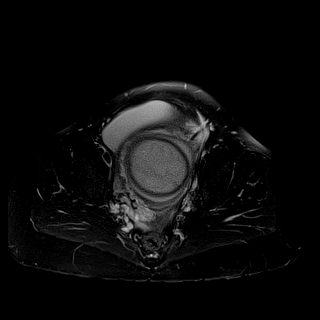
[im 44/87]
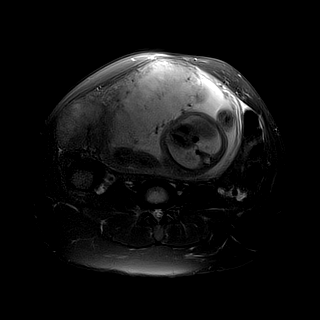
[im 65/87]
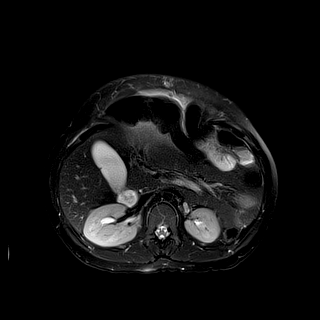
[im 87/87]
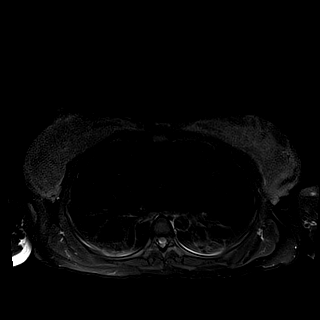

[Series 35: bSSFP · axial · 4.0mm · 0.70mm/px · z∈[-336,-101]mm · 3 of 50 slices shown (3 of 4)]
[im 1/50]
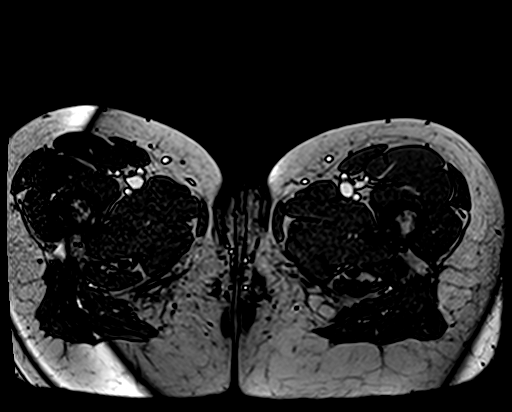
[im 25/50]
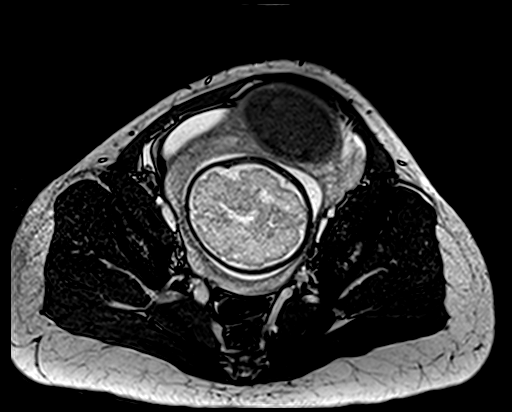
[im 50/50]
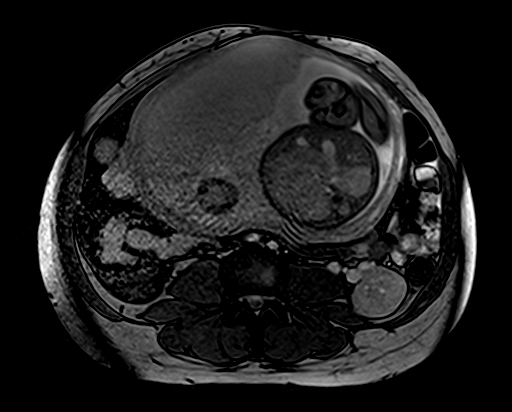

[Series 35: bSSFP · axial · 5.0mm · 1.61mm/px · z∈[-96,+120]mm · 2 of 37 slices shown (4 of 4)]
[im 1/37]
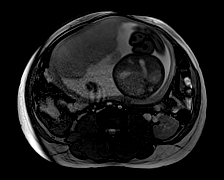
[im 37/37]
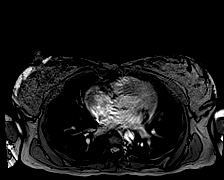

[Series 36: T1 dynamic · axial · 3.0mm · 1.41mm/px · z∈[-100,+137]mm · 4 of 80 slices shown (1 of 2)]
[im 1/80]
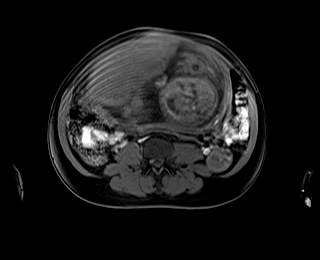
[im 27/80]
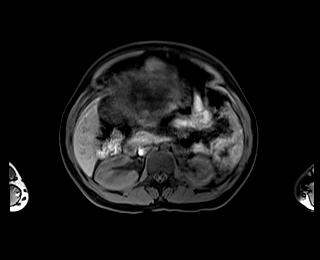
[im 53/80]
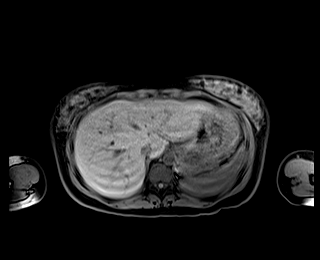
[im 80/80]
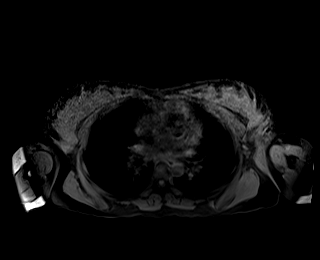

[Series 37: T1 dynamic · axial · 3.0mm · 1.41mm/px · z∈[-340,-103]mm · 4 of 80 slices shown (2 of 2)]
[im 1/80]
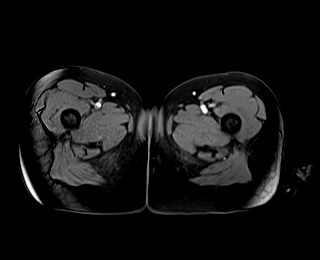
[im 27/80]
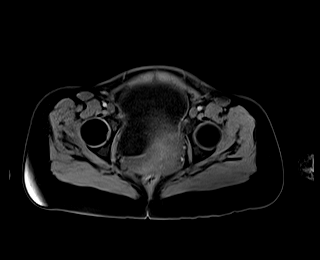
[im 53/80]
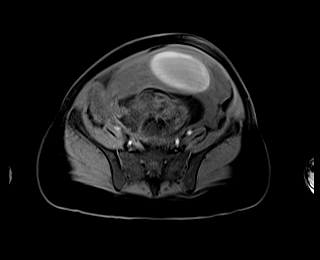
[im 80/80]
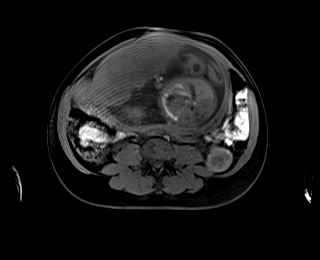

[42 of 48 positions shown; findings below may reference images not displayed]

FINDINGS: COMBINED FINDINGS FOR BOTH MR ABDOMEN AND PELVIS

Lower chest: Unremarkable

Hepatobiliary: Unremarkable

Pancreas:  Unremarkable

Spleen:  Unremarkable

Adrenals/Urinary Tract: Partially duplicated left renal collecting
system. Adrenal glands unremarkable. No significant maternal
hydronephrosis.

Stomach/Bowel: Extends between the cecum and the uterus and along
the right psoas muscle for example on images 18 through 24 of series
5. This has normal appearance for appendix.

Mild prominence of stool in the cecum and ascending colon but not in
the rest of the colon. No dilated bowel identified.

Vascular/Lymphatic:  Unremarkable

Reproductive: Single intrauterine pregnancy noted. Anterior
placenta, no previa. Closed cervix.

Along the anterior uterine margin, a 9.3 by 5.5 by 7.6 cm low T2
signal intensity mass is present in the myometrium, favoring a
fibroid.

Along the right side of the uterus, a subserosal 6.7 by 4.6 by
cm mass is present, and thought to be a subserosal fibroid for
example on image 35 of series 5.

In the fundus of the uterus, a 3.8 by 1.9 by 3.0 cm intramural
fibroid is present for example on image 22 of series 6.

The presence of fibroids has been documented on prior ultrasounds.

Mild indistinctness of the right ovary. However, there is a 5.0 by
1.5 by 4.5 cm (volume = 18 cm^3) collection of ascites in the
right lower quadrant, and a smaller collection of fluid in the left
lower quadrant.

Other:  No supplemental non-categorized findings.

Musculoskeletal: Unremarkable
IMPRESSION: 1. The appendix is normal.
2. There is a volume of 18 cubic cm ascites in the right lower
quadrant, and a smaller collection of fluid in the left lower
quadrant. Etiology uncertain.
3. Uterine fibroids.
4. Partially duplicated left renal collecting system.

## 2022-03-14 IMAGING — MR MR PELVIS W/O CM
16 of 18 series · 42 of 48 positions shown · non-contrast
Comparison: None.

CLINICAL DATA: Right lower quadrant abdominal pain, possible
appendicitis. Third trimester pregnancy.

EXAM:
MRI ABDOMEN AND PELVIS WITHOUT CONTRAST
TECHNIQUE: Multiplanar multisequence MR imaging of the abdomen and pelvis was
performed. No intravenous contrast was administered.

[Series 4: cor haste abd · coronal · 5.0mm · 1.02mm/px · 2 of 34 slices shown]
[im 1/34]
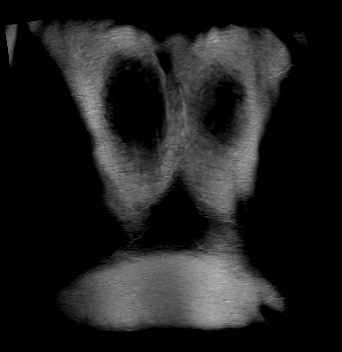
[im 34/34]
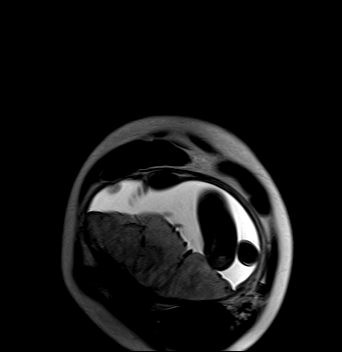

[Series 5: cor haste pelvis · coronal · 4.0mm · 1.02mm/px · 2 of 38 slices shown]
[im 1/38]
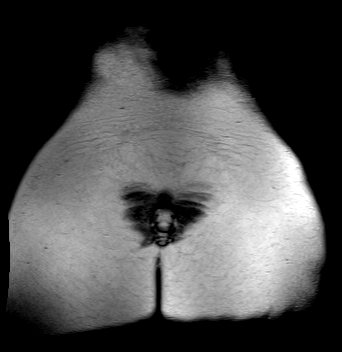
[im 38/38]
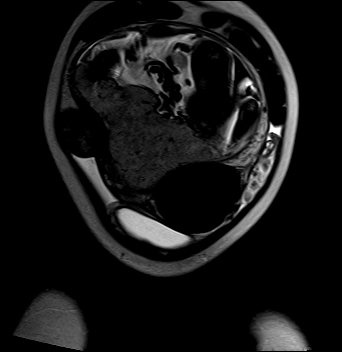

[Series 6: cor haste fs · coronal · 5.0mm · 1.02mm/px · 2 of 34 slices shown (1 of 2)]
[im 1/34]
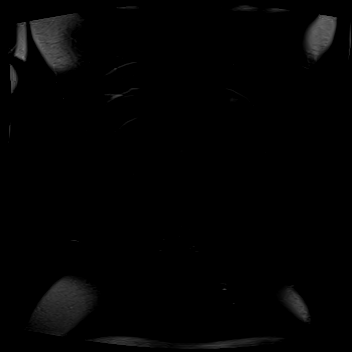
[im 34/34]
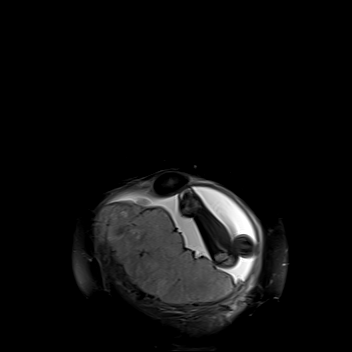

[Series 11: cor haste fs · coronal · 4.0mm · 1.02mm/px · 2 of 38 slices shown (2 of 2)]
[im 1/38]
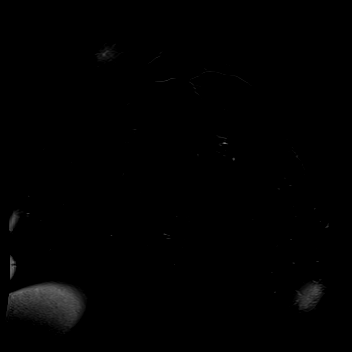
[im 38/38]
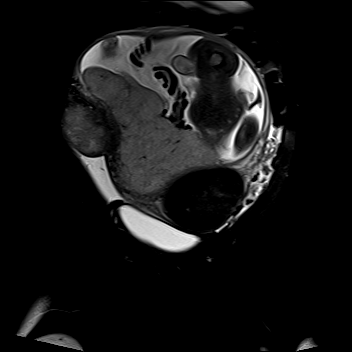

[Series 12: bSSFP · coronal · 5.0mm · 1.61mm/px · 2 of 34 slices shown (1 of 4)]
[im 1/34]
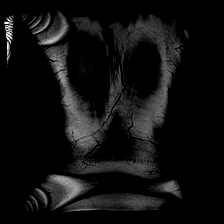
[im 34/34]
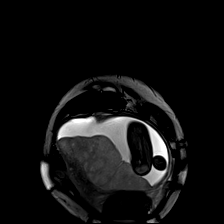

[Series 13: bSSFP · coronal · 4.0mm · 1.61mm/px · 2 of 38 slices shown (2 of 4)]
[im 1/38]
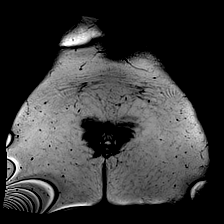
[im 38/38]
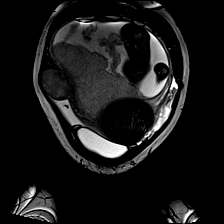

[Series 14: T2 · sagittal · 4.0mm · 0.75mm/px · 2 of 45 slices shown]
[im 1/45]
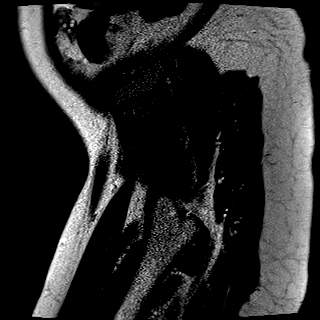
[im 45/45]
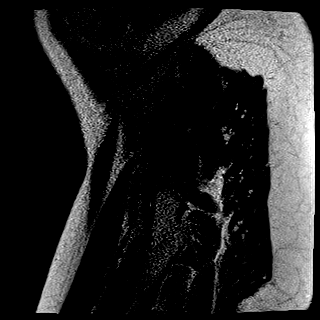

[Series 19: ax haste_comp · axial · 5.0mm · 0.99mm/px · z∈[-96,+120]mm · 2 of 37 slices shown (1 of 2)]
[im 1/37]
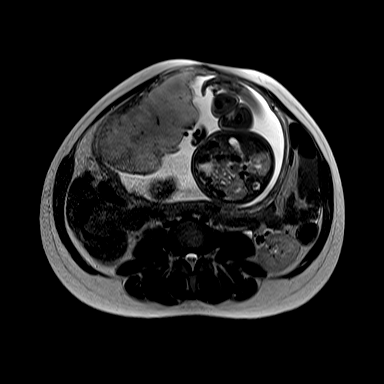
[im 37/37]
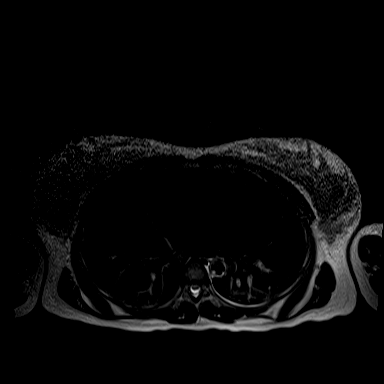

[Series 19: ax haste_comp · axial · 4.0mm · 1.19mm/px · z∈[-336,-101]mm · 3 of 50 slices shown (2 of 2)]
[im 1/50]
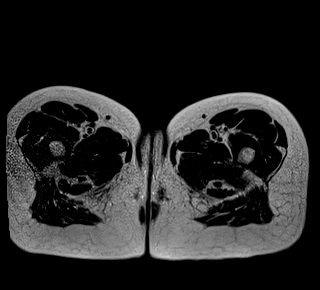
[im 25/50]
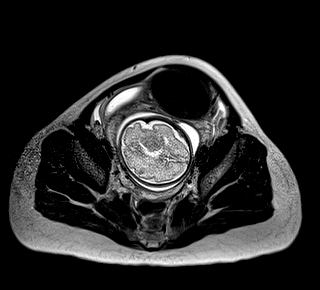
[im 50/50]
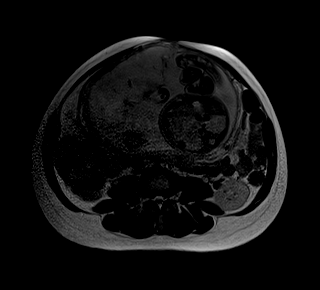

[Series 24: ax haste fs_comp · axial · 4.0mm · 1.19mm/px · z∈[-336,-101]mm · 3 of 50 slices shown (1 of 2)]
[im 1/50]
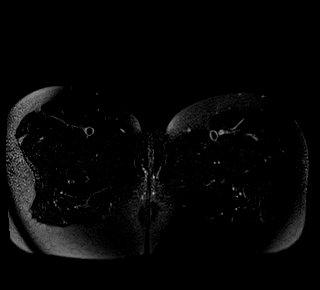
[im 25/50]
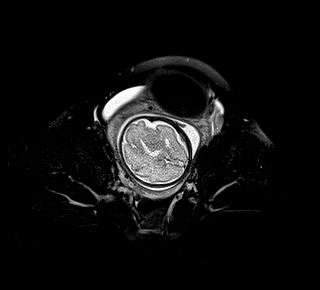
[im 50/50]
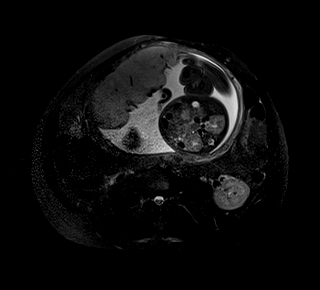

[Series 24: ax haste fs_comp · axial · 5.0mm · 0.99mm/px · z∈[-96,+120]mm · 2 of 37 slices shown (2 of 2)]
[im 1/37]
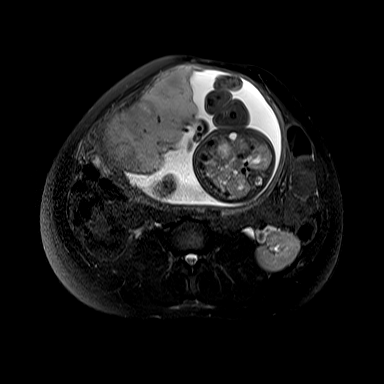
[im 37/37]
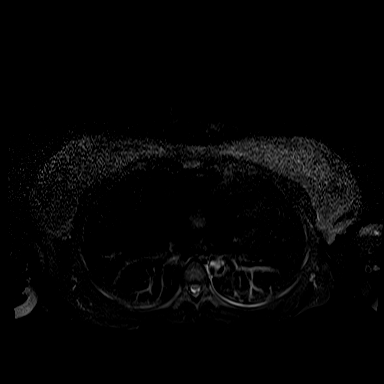

[Series 31: T2 fat-sat · axial · 4.0mm · 1.19mm/px · z∈[-336,+120]mm · 5 of 87 slices shown]
[im 1/87]
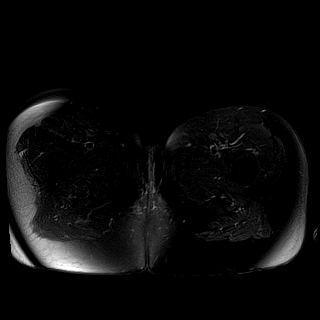
[im 22/87]
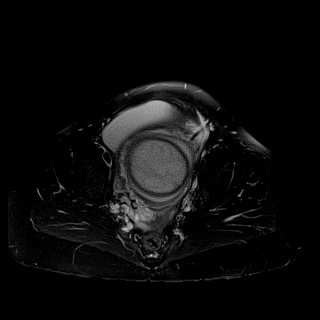
[im 44/87]
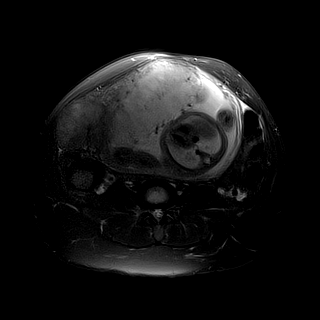
[im 65/87]
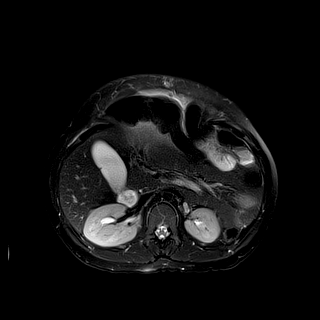
[im 87/87]
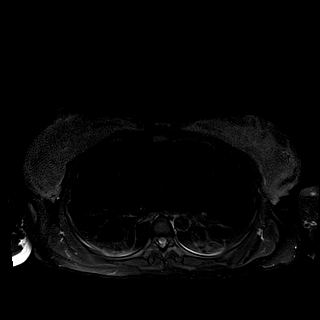

[Series 34: bSSFP · axial · 4.0mm · 0.70mm/px · z∈[-336,-101]mm · 3 of 50 slices shown (3 of 4)]
[im 1/50]
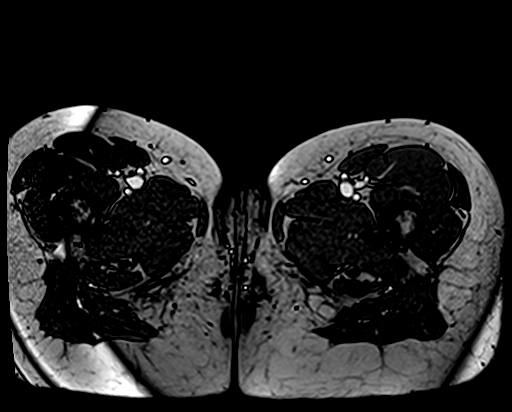
[im 25/50]
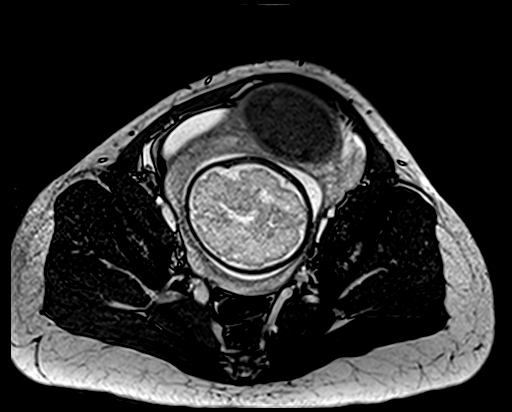
[im 50/50]
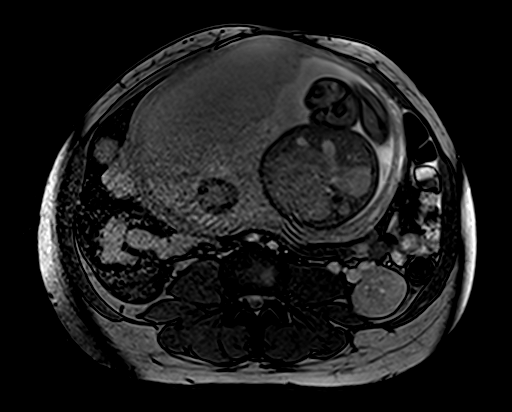

[Series 34: bSSFP · axial · 5.0mm · 1.61mm/px · z∈[-96,+120]mm · 2 of 37 slices shown (4 of 4)]
[im 1/37]
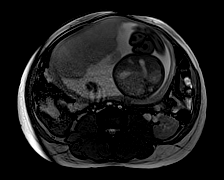
[im 37/37]
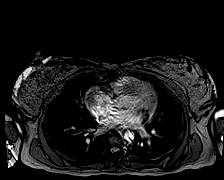

[Series 35: T1 dynamic · axial · 3.0mm · 1.41mm/px · z∈[-100,+137]mm · 4 of 80 slices shown (1 of 2)]
[im 1/80]
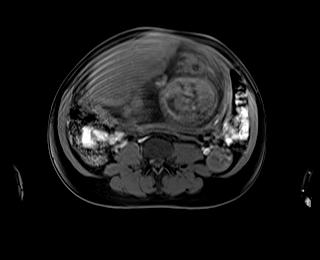
[im 27/80]
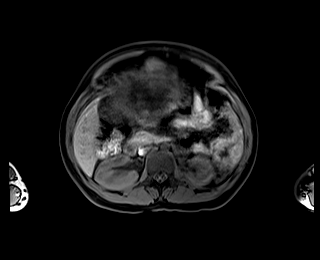
[im 53/80]
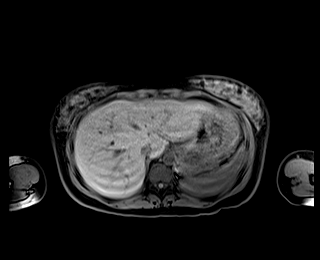
[im 80/80]
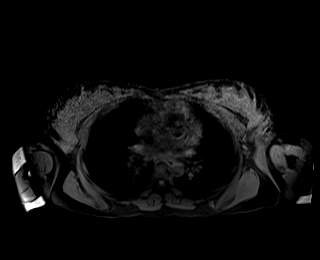

[Series 36: T1 dynamic · axial · 3.0mm · 1.41mm/px · z∈[-340,-103]mm · 4 of 80 slices shown (2 of 2)]
[im 1/80]
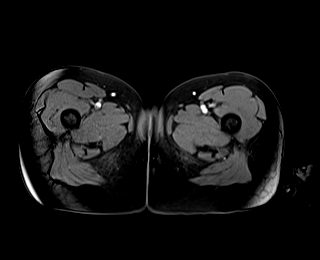
[im 27/80]
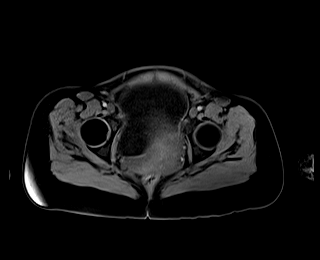
[im 53/80]
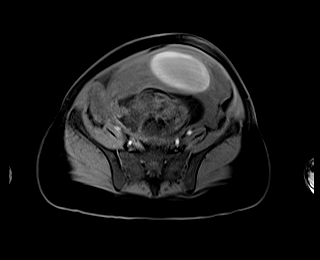
[im 80/80]
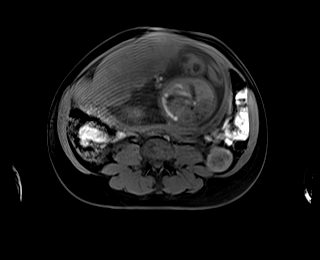

[42 of 48 positions shown; findings below may reference images not displayed]

FINDINGS: COMBINED FINDINGS FOR BOTH MR ABDOMEN AND PELVIS

Lower chest: Unremarkable

Hepatobiliary: Unremarkable

Pancreas:  Unremarkable

Spleen:  Unremarkable

Adrenals/Urinary Tract: Partially duplicated left renal collecting
system. Adrenal glands unremarkable. No significant maternal
hydronephrosis.

Stomach/Bowel: Extends between the cecum and the uterus and along
the right psoas muscle for example on images 18 through 24 of series
5. This has normal appearance for appendix.

Mild prominence of stool in the cecum and ascending colon but not in
the rest of the colon. No dilated bowel identified.

Vascular/Lymphatic:  Unremarkable

Reproductive: Single intrauterine pregnancy noted. Anterior
placenta, no previa. Closed cervix.

Along the anterior uterine margin, a 9.3 by 5.5 by 7.6 cm low T2
signal intensity mass is present in the myometrium, favoring a
fibroid.

Along the right side of the uterus, a subserosal 6.7 by 4.6 by
cm mass is present, and thought to be a subserosal fibroid for
example on image 35 of series 5.

In the fundus of the uterus, a 3.8 by 1.9 by 3.0 cm intramural
fibroid is present for example on image 22 of series 6.

The presence of fibroids has been documented on prior ultrasounds.

Mild indistinctness of the right ovary. However, there is a 5.0 by
1.5 by 4.5 cm (volume = 18 cm^3) collection of ascites in the
right lower quadrant, and a smaller collection of fluid in the left
lower quadrant.

Other:  No supplemental non-categorized findings.

Musculoskeletal: Unremarkable
IMPRESSION: 1. The appendix is normal.
2. There is a volume of 18 cubic cm ascites in the right lower
quadrant, and a smaller collection of fluid in the left lower
quadrant. Etiology uncertain.
3. Uterine fibroids.
4. Partially duplicated left renal collecting system.

## 2022-04-05 ENCOUNTER — Ambulatory Visit: Payer: Medicaid Other | Attending: Nurse Practitioner

## 2022-04-05 DIAGNOSIS — O285 Abnormal chromosomal and genetic finding on antenatal screening of mother: Secondary | ICD-10-CM | POA: Diagnosis not present

## 2022-04-05 DIAGNOSIS — Z3A23 23 weeks gestation of pregnancy: Secondary | ICD-10-CM

## 2022-04-05 DIAGNOSIS — Z148 Genetic carrier of other disease: Secondary | ICD-10-CM

## 2022-04-05 DIAGNOSIS — D563 Thalassemia minor: Secondary | ICD-10-CM

## 2022-04-05 NOTE — Progress Notes (Signed)
[facility] at [facility] [address], [address] Phone:  [phone]   Fax:  [phone]    Name: Meagan Vance Indication:  Maternal Tay-Sachs Disease Carrier Maternal Alpha Thalassemia Silent Carrier Son with Sickle Cell Trait (Hb A/S)  DOB: [birth year] Age: 31 y.o.   EDC: 07/31/2022 LMP: 10/24/2021 Referring Provider:  Laurie Panda, NP  EGA: [redacted]w[redacted]d Genetic Counselor: Teena Dunk, MS, CGC  OB Hx: [id number] Date of Appointment: 04/05/2022  Accompanied by: Janice Norrie Interpreter Face to Face Time: 60 Minutes   Previous Testing Completed: Feliz previously completed cell-free DNA screening (cfDNA) in this pregnancy. The result is low risk. This screening significantly reduces the risk that the current pregnancy has Down syndrome, Trisomy 37, Trisomy 24, and common sex chromosome conditions, however, the risk is not zero given the limitations of cfDNA. Additionally, there are many genetic conditions that cannot be detected by cfDNA.  Wilsie previously completed carrier screening in 2021 and 2023. In 2021 a Horizon 27 panel was ordered and she screened to be a Silent Carrier for Alpha Thalassemia and a carrier for Tay-Sachs Disease. She was negative for 25 other conditions on the panel. In 2023 a Programmer, applications (4 conditions) was ordered and she screened to be a Silent Carrier for Alpha Thalassemia only (Tay-Sachs Disease was not included on the smaller panel). See genetic counseling portion of the note below.    Medical History:  Reports she takes prenatal vitamins. Denies personal history of diabetes, high blood pressure, thyroid conditions, and seizures. Denies bleeding, infections, and fevers in this pregnancy. Denies using tobacco, alcohol, or street drugs in this pregnancy.   Family History: A pedigree was created and scanned into Epic under the Media tab. The family history is very limited. Dondi reports her mother "was  sick" and passed away. She does not know why her father passed away. She reports her husband's brother died from "witchcraft" at an unknown age. No further information about any of these individuals is available for review.  Aletta's son is a carrier for Sickle Cell (Hb A/S). See genetic counseling portio of the note below.  Maternal ethnicity reported as African and paternal ethnicity reported as African. Denies Ashkenazi Jewish ancestry. Denies consanguinity.      Genetic Counseling:   Maternal Carrier for Tay-Sachs Disease. Tay-Sachs Disease is an inherited disorder that affects the brain and nervous system with signs and symptoms usually starting in the first year of life. Symptoms include muscle weakness and loss of motor skills such as turning over, sitting, and crawling. Over time, worsening brain damage, seizures, vision and hearing loss, intellectual disability, and paralysis occurs. Death usually occurs in early childhood. In rare cases, symptoms do not begin until early adulthood and progress slowly.  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 We discussed that Tay-Sachs Disease has autosomal recessive inheritance and that the current pregnancy would only be at risk to be affected if Shivonne's reproductive partner is also a carrier for the condition. If Anzleigh's reproductive partner completes carrier screening for Tay-Sachs and is found to be a carrier, the risk for Tay-Sachs in the current pregnancy would be 1 in 4 (25%). If both members of a couple are known to be carriers, diagnostic testing is available to determine if the pregnancy is affected. This can be performed via amniocentesis after [redacted] weeks gestation. This procedure involves the removal of a small amount of amniotic fluid from the sac  surrounding the pregnancy with the use of a thin needle inserted through the maternal abdomen and uterus. Ultrasound guidance is used  throughout the procedure. Possible procedural difficulties and complications that can arise include maternal infection, cramping, bleeding, fluid leakage, and/or pregnancy loss. The risk for pregnancy loss with an amniocentesis is 1/500. If diagnostic testing via amniocentesis is not desired, Tay-Sachs testing can be completed after birth.  We discussed that carrier testing for Tay-Sachs is recommended for Sheily's reproductive partner. Partner screening may include both DNA and Tay-Sachs enzyme (Hexosaminidase A testing). This is because some people who are carriers for Tay-Sachs Disease have a normal (negative) DNA test, but the enzyme test shows that they are a carrier for Tay-Sachs. DNA testing for Tay-Sachs Disease can detect most of the mutations in people of Ashkenazi [place] descent. DNA testing detects fewer mutations in people of other ethnic backgrounds; however, enzyme testing detects the vast majority of carriers in all ethnic backgrounds. If Tomasina's reproductive partner pursued carrier screening via a blood sample we could order DNA and enzyme testing for Tay-Sachs Disease. If Astraea's reproductive partner pursued carrier screening via a saliva sample we could only order DNA testing and we would not be able to order enzyme testing. We discussed the limitations of saliva testing with Allexa. After hearing the above information, Emilyn took a saliva kit home for her reproductive partner and stated she is going to talk to him to see if he is interested in completing this screening.   Maternal Silent Carrier for Alpha Thalassemia. Alpha Thalassemia refers to a group of autosomal recessive blood disorders that reduce the amount of hemoglobin, the protein in red blood cells that carries oxygen to tissues throughout the body. Hemoglobin is made up of both alpha globin and beta globin proteins. Alpha Thalassemia is different in its inheritance compared to other  hemoglobinopathies as there are two alpha-globin genes on each chromosome 16 (??/??). Alpha Thalassemia occurs when three or more of the four alpha-globin genes are deleted or changed. Ryenne is a silent carrier for alpha thalassemia (??/?-) caused by the pathogenic alpha 3.7 deletion of the HBA2 gene. With this result, we know that Haden has three functional copies of the alpha-globin genes while her 4th alpha-globin gene is deleted. Each of Evalynn's children will either inherit two functional genes (??) or one functional gene and one deletion (?-) from her. Maleyah is not at increased risk to have a baby with fetal hydrops due to Hemoglobin Barts disease (four deleted or changed alpha-globin genes: --/--) regardless of her reproductive partner's carrier status. If Mario's reproductive partner is found to be an Alpha Thalassemia carrier in the cis configuration (two deleted or changed alpha-globin genes on the same chromosome: ??/--) there would be a 25% risk for the current pregnancy to be affected with Hemoglobin H Disease (three deleted or changed alpha-globin genes: --/?-). Clinical features of this condition are highly variable and generally develop in the first years of life. People with severe symptoms may have chronic anemia, liver disease, and bone changes. Some affected individuals do not require blood transfusions while others may require occasional blood transfusions throughout their lifetime. Given Zikeria's carrier screening result, carrier screening for her reproductive partner was offered to determine risk for the current pregnancy. If both members of a couple are known to be carriers, diagnostic testing is available to determine if the pregnancy is affected. This can be performed via amniocentesis after [redacted] weeks gestation. This procedure involves the removal of  a small amount of amniotic fluid from the sac surrounding the pregnancy with the use of a thin needle  inserted through the maternal abdomen and uterus. Ultrasound guidance is used throughout the procedure. Possible procedural difficulties and complications that can arise include maternal infection, cramping, bleeding, fluid leakage, and/or pregnancy loss. The risk for pregnancy loss with an amniocentesis is 1/500. If diagnostic testing via amniocentesis is not desired, Alpha Thalassemia testing can be completed after birth. After hearing the above information, Eliana took a saliva kit home for her reproductive partner and stated she is going to talk to him to see if he is interested in completing this screening.   Son with Sickle Cell Trait (Hb A/S). Katryna's son Valarie Merino Adjonou DOB 08/13/2020) was identified on newborn screening to have Sickle Cell Trait. Genetic counseling briefly reviewed Sickle Cell Trait and Sickle Cell Disease with Eyanna. We discussed that since Betsy's carrier screen for Beta-Hemoglobinopathies (including Sickle Cell) is screen negative that most likely her son's Sickle Cell Trait was inherited from his father (Janee's reproductive partner). We discussed that given Judaea's negative result on carrier screening it is highly unlikely for any of her biological children to be affected with Beta-Hemoglobinopathies.      Patient Plan:  Proceed with: After hearing the above information, Deziray took a saliva kit home for her reproductive partner and stated she is going to talk to him to see if he is interested in completing this screening. Informed consent was obtained. All questions were answered.  Declined: Carrier Screening for her reproductive partner via a blood sample   Thank you for sharing in the care of Imaan with [place].  Please do not hesitate to contact us if you have any questions.  Staci Righter, MS, Shelly Certified Genetic Counselor  Genetic counseling student involved in appointment: Yes (S.B.)

## 2022-05-07 ENCOUNTER — Other Ambulatory Visit: Payer: Self-pay

## 2022-05-08 ENCOUNTER — Telehealth: Payer: Self-pay | Admitting: Genetics

## 2022-05-08 NOTE — Telephone Encounter (Signed)
Spoke with both Meagan Vance and her reproductive partner Meagan Vance) regarding Meagan Vance's carrier screening results. Meagan Vance screened to NOT be a carrier for the two conditions Meagan Vance is a carrier for (Alpha Thalassemia and Tay-Sachs Disease). A negative result on carrier screening reduces the chance of being a carrier but does not rule out the possibility entirely. We also reviewed that Meagan Vance screened to be a carrier for Sickle Cell Disease. We discussed that we anticipated this result already given that Meagan Vance screened to not be a carrier for Sickle Cell Disease and the couple's son Meagan Vance) is a carrier for the condition. We reviewed that it is unlikely for any of their children together to be affected with Alpha Thalassemia, Tay-Sachs Disease, or Sickle Cell Disease given these results. All questions answered.

## 2022-07-17 NOTE — L&D Delivery Note (Signed)
[facility] Delivery Note  Meagan Vance is a 32 y.o. G2P1001 s/p VD at [redacted]w[redacted]d She was admitted for latent labor, postdates.   ROM: 13h 585mith clear fluid GBS Status:  Positive/-- (01/16 0000) Maximum Maternal Temperature: 98.7  Labor Progress: Initial SVE: 1.5/80/-2. She then progressed to complete.   Delivery Date/Time: 09/03/2022 @0754$  Delivery: Called to room and patient was complete and pushing. Head delivered OA. No nuchal cord present. Shoulder and body delivered in usual fashion. Infant with spontaneous cry, placed on mother's abdomen, dried and stimulated. Cord clamped x 2 after 1-minute delay, and cut by this provider. Cord blood drawn. Placenta delivered spontaneously with gentle cord traction. Fundus firm with massage and Pitocin. Labia, perineum, vagina, and cervix inspected without any lacerations and good hemostasis.  Baby Weight: pending  Placenta: 3 vessel, intact. Sent to L&D Complications: None Lacerations: None EBL: 87 mL Analgesia: Epidural   Infant:  APGAR (1 MIN): 8   APGAR (5 MINS): 9    Tracey Stewart Autry-Lott, DO OB Fellow, [place] [employer]/18/2024, 8:26 AM

## 2022-08-01 LAB — OB RESULTS CONSOLE GBS: GBS: POSITIVE

## 2022-08-27 ENCOUNTER — Other Ambulatory Visit: Payer: Self-pay | Admitting: Advanced Practice Midwife

## 2022-08-27 ENCOUNTER — Inpatient Hospital Stay (HOSPITAL_COMMUNITY)
Admission: AD | Admit: 2022-08-27 | Discharge: 2022-08-27 | Disposition: A | Discharge status: Home / Self Care | Payer: Medicaid Other | Attending: Obstetrics & Gynecology | Admitting: Obstetrics & Gynecology

## 2022-08-27 ENCOUNTER — Encounter (HOSPITAL_COMMUNITY): Payer: Self-pay | Admitting: *Deleted

## 2022-08-27 DIAGNOSIS — Z3A40 40 weeks gestation of pregnancy: Secondary | ICD-10-CM | POA: Diagnosis not present

## 2022-08-27 DIAGNOSIS — O480 Post-term pregnancy: Secondary | ICD-10-CM | POA: Diagnosis not present

## 2022-08-27 DIAGNOSIS — O471 False labor at or after 37 completed weeks of gestation: Secondary | ICD-10-CM | POA: Diagnosis not present

## 2022-08-27 HISTORY — DX: Benign neoplasm of connective and other soft tissue, unspecified: D21.9

## 2022-08-27 HISTORY — DX: Anemia, unspecified: D64.9

## 2022-08-27 NOTE — MAU Note (Signed)
EMS reports: 2nd baby. Contractions started at 0500, now every 5-29mn. no bleeding or leaking.  VSS.   Blood sugar was 83.  Denies problem with pregnancy.

## 2022-08-27 NOTE — MAU Provider Note (Signed)
Event Date/Time   First Provider Initiated Contact with Patient 08/27/22 0934       S: Ms. Meagan Vance is a 32 y.o. G2P1001 at [redacted]w[redacted]d who presents to MAU today complaining contractions q 5-10 minutes since 0500. She denies vaginal bleeding. She denies LOF. She reports normal fetal movement.    O: BP 106/63   Pulse 97   Temp 98.6 F (37 C) (Oral)   Resp 16   Ht 5' (1.524 m)   Wt 77.2 kg   SpO2 100%   BMI 33.26 kg/m  GENERAL: Well-developed, well-nourished female in no acute distress.  HEAD: Normocephalic, atraumatic.  CHEST: Normal effort of breathing, regular heart rate ABDOMEN: Soft, nontender, gravid  Cervical exam:  Dilation: Closed Effacement (%): 40 Cervical Position: Posterior Station: -3 Exam by:: jolynn   Fetal Monitoring: Baseline: 120 Variability: moderate Accelerations: 15x15 Decelerations: none Contractions: irregular   A: 1. False labor after 37 completed weeks of gestation   2. [redacted] weeks gestation of pregnancy      P: DC home in stable condition  Comfort measures reviewed  3rd Trimester precautions  Bleeding precautions Labor precautions  Fetal kick counts RX: none  Return to MAU as needed Patient has appt at the HD on 2/12 and IOL scheduled for 2/17  - Patient arrived via EMS today and she asked if she had to take EMS in order to be able to be seen here. I advised patient that she could arrive via private vehicle, and confirmed she does have access to a car. She states that her husband has a car and can bring her, but she thought she had to take EMS in order to be able to access ED. Advised she can have her husband bring her if she feels she needs to be seen again.    Follow-up Information     [facility]   Why: As scheduled Contact information: [phone]                  Doralee Kocak DNP, CNM  08/27/22  9:39 AM

## 2022-08-27 NOTE — MAU Note (Signed)
Meagan Vance is a 32 y.o. at 61w2dhere in MAU reporting: reports ctxs started at 0500.  No bleeding or leaking. Reports +fm. Prior vag , uncomplicated delivery.  No recent exams Care at the HD.  +GBS Onset of complaint: 0500 Pain score: moderate Vitals:   08/27/22 0829  BP: 97/60  Pulse: 96  Resp: 16  Temp: 98.6 F (37 C)  SpO2: 100%     FHT:132 Lab orders placed from triage: none

## 2022-08-28 ENCOUNTER — Telehealth (HOSPITAL_COMMUNITY): Payer: Self-pay | Admitting: *Deleted

## 2022-08-28 ENCOUNTER — Encounter (HOSPITAL_COMMUNITY): Payer: Self-pay | Admitting: *Deleted

## 2022-08-28 NOTE — Telephone Encounter (Signed)
Preadmission screen  

## 2022-08-28 NOTE — Telephone Encounter (Signed)
[id number] interpreter number  Preadmission screen

## 2022-08-30 ENCOUNTER — Other Ambulatory Visit: Payer: Self-pay | Admitting: Advanced Practice Midwife

## 2022-08-31 ENCOUNTER — Inpatient Hospital Stay (HOSPITAL_COMMUNITY): Payer: Medicaid Other

## 2022-08-31 ENCOUNTER — Inpatient Hospital Stay (HOSPITAL_COMMUNITY): Admission: RE | Admit: 2022-08-31 | Payer: Medicaid Other | Source: Home / Self Care | Admitting: Family Medicine

## 2022-09-01 ENCOUNTER — Telehealth (HOSPITAL_COMMUNITY): Payer: Self-pay | Admitting: *Deleted

## 2022-09-01 NOTE — Telephone Encounter (Signed)
Preadmission screen  

## 2022-09-02 ENCOUNTER — Encounter (HOSPITAL_COMMUNITY): Payer: Self-pay | Admitting: Family Medicine

## 2022-09-02 ENCOUNTER — Inpatient Hospital Stay (HOSPITAL_COMMUNITY): Payer: Medicaid Other | Admitting: Anesthesiology

## 2022-09-02 ENCOUNTER — Inpatient Hospital Stay (HOSPITAL_COMMUNITY)
Admission: AD | Admit: 2022-09-02 | Discharge: 2022-09-05 | DRG: 806 | Disposition: A | Discharge status: Home / Self Care | Payer: Medicaid Other | Attending: Obstetrics and Gynecology | Admitting: Obstetrics and Gynecology

## 2022-09-02 ENCOUNTER — Other Ambulatory Visit: Payer: Self-pay

## 2022-09-02 ENCOUNTER — Inpatient Hospital Stay (HOSPITAL_COMMUNITY): Payer: Medicaid Other

## 2022-09-02 DIAGNOSIS — O3413 Maternal care for benign tumor of corpus uteri, third trimester: Secondary | ICD-10-CM | POA: Diagnosis present

## 2022-09-02 DIAGNOSIS — Z148 Genetic carrier of other disease: Secondary | ICD-10-CM

## 2022-09-02 DIAGNOSIS — O99284 Endocrine, nutritional and metabolic diseases complicating childbirth: Secondary | ICD-10-CM | POA: Diagnosis present

## 2022-09-02 DIAGNOSIS — E7502 Tay-Sachs disease: Secondary | ICD-10-CM | POA: Diagnosis present

## 2022-09-02 DIAGNOSIS — O9902 Anemia complicating childbirth: Secondary | ICD-10-CM | POA: Diagnosis present

## 2022-09-02 DIAGNOSIS — B951 Streptococcus, group B, as the cause of diseases classified elsewhere: Secondary | ICD-10-CM | POA: Diagnosis present

## 2022-09-02 DIAGNOSIS — O99824 Streptococcus B carrier state complicating childbirth: Secondary | ICD-10-CM | POA: Diagnosis present

## 2022-09-02 DIAGNOSIS — O480 Post-term pregnancy: Secondary | ICD-10-CM | POA: Diagnosis present

## 2022-09-02 DIAGNOSIS — D259 Leiomyoma of uterus, unspecified: Secondary | ICD-10-CM | POA: Diagnosis present

## 2022-09-02 DIAGNOSIS — D563 Thalassemia minor: Secondary | ICD-10-CM | POA: Diagnosis present

## 2022-09-02 DIAGNOSIS — Z3A41 41 weeks gestation of pregnancy: Secondary | ICD-10-CM | POA: Diagnosis not present

## 2022-09-02 DIAGNOSIS — O99820 Streptococcus B carrier state complicating pregnancy: Secondary | ICD-10-CM | POA: Diagnosis not present

## 2022-09-02 DIAGNOSIS — D219 Benign neoplasm of connective and other soft tissue, unspecified: Secondary | ICD-10-CM | POA: Diagnosis present

## 2022-09-02 LAB — TYPE AND SCREEN
ABO/RH(D): A POS
Antibody Screen: NEGATIVE

## 2022-09-02 LAB — CBC
HCT: 33.7 % — ABNORMAL LOW (ref 36.0–46.0)
Hemoglobin: 11 g/dL — ABNORMAL LOW (ref 12.0–15.0)
MCH: 28.6 pg (ref 26.0–34.0)
MCHC: 32.6 g/dL (ref 30.0–36.0)
MCV: 87.8 fL (ref 80.0–100.0)
Platelets: 185 10*3/uL (ref 150–400)
RBC: 3.84 MIL/uL — ABNORMAL LOW (ref 3.87–5.11)
RDW: 15.2 % (ref 11.5–15.5)
WBC: 5.6 10*3/uL (ref 4.0–10.5)
nRBC: 0 % (ref 0.0–0.2)

## 2022-09-02 LAB — RPR: RPR Ser Ql: NONREACTIVE

## 2022-09-02 MED ORDER — ACETAMINOPHEN 325 MG PO TABS: 650.0000 mg | ORAL_TABLET | ORAL | Status: DC | PRN

## 2022-09-02 MED ORDER — MISOPROSTOL 50MCG HALF TABLET
50.0000 ug | ORAL_TABLET | ORAL | Status: DC
  Administered 2022-09-02: 50 ug via BUCCAL
  Filled 2022-09-02: qty 1

## 2022-09-02 MED ORDER — FLEET ENEMA 7-19 GM/118ML RE ENEM: 1.0000 | ENEMA | RECTAL | Status: DC | PRN

## 2022-09-02 MED ORDER — OXYCODONE-ACETAMINOPHEN 5-325 MG PO TABS: 2.0000 | ORAL_TABLET | ORAL | Status: DC | PRN

## 2022-09-02 MED ORDER — LACTATED RINGERS IV SOLN: 500.0000 mL | INTRAVENOUS | Status: DC | PRN

## 2022-09-02 MED ORDER — OXYCODONE-ACETAMINOPHEN 5-325 MG PO TABS: 1.0000 | ORAL_TABLET | ORAL | Status: DC | PRN

## 2022-09-02 MED ORDER — FENTANYL CITRATE (PF) 100 MCG/2ML IJ SOLN: 50.0000 ug | INTRAMUSCULAR | Status: DC | PRN

## 2022-09-02 MED ORDER — PHENYLEPHRINE 80 MCG/ML (10ML) SYRINGE FOR IV PUSH (FOR BLOOD PRESSURE SUPPORT): 80.0000 ug | PREFILLED_SYRINGE | INTRAVENOUS | Status: DC | PRN

## 2022-09-02 MED ORDER — FENTANYL CITRATE (PF) 100 MCG/2ML IJ SOLN
INTRAMUSCULAR | Status: AC
  Filled 2022-09-02: qty 2

## 2022-09-02 MED ORDER — LIDOCAINE HCL (PF) 1 % IJ SOLN
INTRAMUSCULAR | Status: DC | PRN
  Administered 2022-09-02: 2 mL via EPIDURAL
  Administered 2022-09-02: 10 mL via EPIDURAL

## 2022-09-02 MED ORDER — OXYTOCIN BOLUS FROM INFUSION
333.0000 mL | Freq: Once | INTRAVENOUS | Status: AC
  Administered 2022-09-03: 333 mL via INTRAVENOUS

## 2022-09-02 MED ORDER — PENICILLIN G POT IN DEXTROSE 60000 UNIT/ML IV SOLN
3.0000 10*6.[IU] | INTRAVENOUS | Status: DC
  Administered 2022-09-02 – 2022-09-03 (×4): 3 10*6.[IU] via INTRAVENOUS
  Filled 2022-09-02 (×4): qty 50

## 2022-09-02 MED ORDER — SODIUM CHLORIDE 0.9 % IV SOLN
5.0000 10*6.[IU] | Freq: Once | INTRAVENOUS | Status: AC
  Administered 2022-09-02: 5 10*6.[IU] via INTRAVENOUS
  Filled 2022-09-02: qty 5

## 2022-09-02 MED ORDER — LACTATED RINGERS IV SOLN: INTRAVENOUS | Status: DC

## 2022-09-02 MED ORDER — EPHEDRINE 5 MG/ML INJ: 10.0000 mg | INTRAVENOUS | Status: DC | PRN

## 2022-09-02 MED ORDER — PHENYLEPHRINE 80 MCG/ML (10ML) SYRINGE FOR IV PUSH (FOR BLOOD PRESSURE SUPPORT)
80.0000 ug | PREFILLED_SYRINGE | INTRAVENOUS | Status: DC | PRN
  Filled 2022-09-02: qty 10

## 2022-09-02 MED ORDER — LIDOCAINE HCL (PF) 1 % IJ SOLN: 30.0000 mL | INTRAMUSCULAR | Status: DC | PRN

## 2022-09-02 MED ORDER — FENTANYL-BUPIVACAINE-NACL 0.5-0.125-0.9 MG/250ML-% EP SOLN
EPIDURAL | Status: DC | PRN
  Administered 2022-09-02: 12 mL/h via EPIDURAL

## 2022-09-02 MED ORDER — FENTANYL-BUPIVACAINE-NACL 0.5-0.125-0.9 MG/250ML-% EP SOLN
12.0000 mL/h | EPIDURAL | Status: DC | PRN
  Filled 2022-09-02: qty 250

## 2022-09-02 MED ORDER — DIPHENHYDRAMINE HCL 50 MG/ML IJ SOLN
12.5000 mg | INTRAMUSCULAR | Status: DC | PRN
  Administered 2022-09-03: 12.5 mg via INTRAVENOUS
  Filled 2022-09-02: qty 1

## 2022-09-02 MED ORDER — SOD CITRATE-CITRIC ACID 500-334 MG/5ML PO SOLN: 30.0000 mL | ORAL | Status: DC | PRN

## 2022-09-02 MED ORDER — BUPIVACAINE HCL (PF) 0.25 % IJ SOLN
INTRAMUSCULAR | Status: DC | PRN
  Administered 2022-09-02: 8 mL via EPIDURAL

## 2022-09-02 MED ORDER — LACTATED RINGERS IV SOLN: 500.0000 mL | Freq: Once | INTRAVENOUS | Status: DC

## 2022-09-02 MED ORDER — LIDOCAINE-EPINEPHRINE (PF) 2 %-1:200000 IJ SOLN
INTRAMUSCULAR | Status: DC | PRN
  Administered 2022-09-02: 10 mL via EPIDURAL

## 2022-09-02 MED ORDER — TERBUTALINE SULFATE 1 MG/ML IJ SOLN: 0.2500 mg | Freq: Once | INTRAMUSCULAR | Status: DC | PRN

## 2022-09-02 MED ORDER — OXYTOCIN-SODIUM CHLORIDE 30-0.9 UT/500ML-% IV SOLN
2.5000 [IU]/h | INTRAVENOUS | Status: DC
  Filled 2022-09-02: qty 500

## 2022-09-02 MED ORDER — FENTANYL CITRATE (PF) 100 MCG/2ML IJ SOLN
INTRAMUSCULAR | Status: DC | PRN
  Administered 2022-09-02: 100 ug via INTRAVENOUS

## 2022-09-02 MED ORDER — ONDANSETRON HCL 4 MG/2ML IJ SOLN: 4.0000 mg | Freq: Four times a day (QID) | INTRAMUSCULAR | Status: DC | PRN

## 2022-09-02 MED ORDER — EPHEDRINE 5 MG/ML INJ
10.0000 mg | INTRAVENOUS | Status: DC | PRN
  Administered 2022-09-02: 10 mg via INTRAVENOUS
  Filled 2022-09-02: qty 5

## 2022-09-02 MED ORDER — FENTANYL CITRATE (PF) 100 MCG/2ML IJ SOLN
100.0000 ug | INTRAMUSCULAR | Status: DC | PRN
  Administered 2022-09-02: 50 ug via INTRAVENOUS
  Administered 2022-09-02: 100 ug via INTRAVENOUS
  Filled 2022-09-02 (×2): qty 2

## 2022-09-02 NOTE — Anesthesia Preprocedure Evaluation (Signed)
Anesthesia Evaluation  Patient identified by MRN, date of birth, ID band Patient awake    Reviewed: Allergy & Precautions, H&P , NPO status , Patient's Chart, lab work & pertinent test results  Airway Mallampati: II  TM Distance: >3 FB Neck ROM: Full    Dental no notable dental hx.    Pulmonary neg pulmonary ROS   Pulmonary exam normal breath sounds clear to auscultation       Cardiovascular negative cardio ROS Normal cardiovascular exam Rhythm:Regular Rate:Normal     Neuro/Psych negative neurological ROS  negative psych ROS   GI/Hepatic negative GI ROS, Neg liver ROS,,,  Endo/Other  negative endocrine ROS    Renal/GU negative Renal ROS  negative genitourinary   Musculoskeletal negative musculoskeletal ROS (+)    Abdominal   Peds negative pediatric ROS (+)  Hematology  (+) Blood dyscrasia, anemia Hb 11, plt 185   Anesthesia Other Findings   Reproductive/Obstetrics negative OB ROS                             Anesthesia Physical Anesthesia Plan  ASA: 2  Anesthesia Plan: Epidural   Post-op Pain Management:    Induction:   PONV Risk Score and Plan: 2  Airway Management Planned: Natural Airway  Additional Equipment: None  Intra-op Plan:   Post-operative Plan:   Informed Consent: I have reviewed the patients History and Physical, chart, labs and discussed the procedure including the risks, benefits and alternatives for the proposed anesthesia with the patient or authorized representative who has indicated his/her understanding and acceptance.       Plan Discussed with:   Anesthesia Plan Comments:        Anesthesia Quick Evaluation

## 2022-09-02 NOTE — Progress Notes (Signed)
LABOR PROGRESS NOTE  Meagan Vance is a 32 y.o. G2P1001 at [redacted]w[redacted]d admitted for IOL for postdates  Subjective: Bouncing on ball o  Objective: BP (!) 103/54   Pulse 72   Temp 97.8 F (36.6 C) (Oral)   Resp 17   SpO2 100%  or  Vitals:   09/02/22 0424 09/02/22 0726 09/02/22 1008 09/02/22 1320  BP: 111/66 97/62 (!) 109/49 (!) 103/54  Pulse: 74 76 87 72  Resp: 17     Temp: 98.7 F (37.1 C) 98.3 F (36.8 C)  97.8 F (36.6 C)  TempSrc: Oral Oral  Oral  SpO2: 100%       Dilation: 1.5 Effacement (%): 80 Cervical Position: Middle Station: -2 Presentation: Vertex Exam by:: Dr. 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 G2P1001 at 468w1dere for IOL for post dates   Labor: Slow progression.  S/p Cytotec 50 mcg x 1 buccal.  Patient does not wish to have further dose of Cytotec.  She did agree to Foley balloon send Foley balloon placed.  Discussed AROM after Foley balloon is out and patient will consider this. Fetal Wellbeing: Category 1, currently on intermittent monitoring Pain Control: Per patient request Anticipated MOD: Vaginal delivery  ViGifford ShaveMD  OB Fellow  09/02/2022, 2:53 PM

## 2022-09-02 NOTE — Progress Notes (Signed)
Labor Progress Note Meagan Vance is a 32 y.o. G2P1001 at 29w1dpresented for latent labor/IOL for postdates.   S: Feeling pelvic discomfort with contractions that comes and goes.   O:  BP 108/62   Pulse (!) 108   Temp 98.7 F (37.1 C) (Oral)   Resp 18   SpO2 99%  EFM: 125bpm/moderate/+accels, no decels  CVE: Dilation: 7 Effacement (%): 80 Cervical Position: Middle Station: 0 Presentation: Vertex Exam by:: MLinton Rump RN   A&P: 32y.o. G2P1001 452w1dere for latent labor, IOL.  #Labor: Progressing well. Expectant managment #Pain: Epidural #FWB: Cat I  #GBS positive, PCN ppx    Meagan Sedore Autry-Lott, DO 9:58 PM

## 2022-09-02 NOTE — H&P (Addendum)
Meagan Vance is a 32 y.o. female G2P1001  at [redacted]w[redacted]d presenting for labor evaluation and postdates.  IOL scheduled today.  Pregnancy has been complicated by uterine fibroids. Pt speaks [place] interpreter and prefers to speak [place]   Pt declines medication for induction. She wants labor to begin and progress naturally.  [place] OB Follow up on 07/06/20 with normal FHR, anterior fundal placenta, breech position, normal AFI, and EFW 46%  Nursing Staff Provider  Office Location CP[place]ating   LMP  Language  French Anatomy [place]  bilateral fetal pyelectasis   Flu Vaccine  Declined 05/25/20 Genetic Screen   NIPS low risk female AFP negative   TDaP vaccine  06/07/20 Hgb A1C or  GTT Early  Third trimester   COVID vaccine  No   LAB RESULTS   Rhogam  N/A Blood Type A/Positive/-- (08/12 1458)   Feeding Plan Both Antibody Positive, See Final Results (08/12 1458)  Contraception Undecided Rubella >33.00 (08/12 1458)  Circumcision Yes RPR Non Reactive (08/12 1458)   Pediatrician  List given HBsAg Negative (08/12 1458)   Support Person Adgonou( FOB) HCVAb Negative  Prenatal Classes  too late at 34weeks HIV Non Reactive (08/12 1458)     BTL Consent NA GBS  (For PCN allergy, check sensitivities)   VB[facility] Consent NA [facility]   Hgb Electro    BP Cuff Self Pay-gave at visit-02/26/20 CF     SMA     Waterbirth  [ ]$  Class [ ]$  Consent [ ]$  CNM visit    Induction  [ ]$  Orders Entered [ ]$ Foley Y/N   OB History     Gravida  2   Para  1   Term  1   Preterm      AB      Living  1      SAB      IAB      Ectopic      Multiple  0   Live Births  1          Past Medical History:  Diagnosis Date   Anemia    Fibroid    Past Surgical History:  Procedure Laterality Date   NO PAST SURGERIES     Family History: family history includes Hypertension in her maternal uncle; Other in her mother. Social History:  reports that she has never smoked. She has never  used smokeless tobacco. She reports that she does not currently use alcohol. She reports that she does not use drugs.     Maternal Diabetes: No Genetic Screening: Normal Maternal Ultrasounds/Referrals: Normal Fetal Ultrasounds or other Referrals:  None Maternal Substance Abuse:  No Significant Maternal Medications:  None Significant Maternal Lab Results:  Group B Strep positive Number of Prenatal Visits:greater than 3 verified prenatal visits Other Comments:  None  Review of Systems  Constitutional:  Negative for chills, fatigue and fever.  Eyes:  Negative for visual disturbance.  Respiratory:  Negative for shortness of breath.   Cardiovascular:  Negative for chest pain.  Gastrointestinal:  Positive for abdominal pain. Negative for vomiting.  Genitourinary:  Negative for difficulty urinating, dysuria, flank pain, pelvic pain, vaginal bleeding, vaginal discharge and vaginal pain.  Neurological:  Negative for dizziness and headaches.  Psychiatric/Behavioral: Negative.     Maternal Medical History:  Reason for admission: Contractions.  Postdates and pt was scheduled for IOL on 2/16 but was unable to come in .  Contractions: Onset was 3-5 hours ago.   Fetal activity: Perceived fetal activity is normal.   Prenatal complications: no prenatal complications Prenatal Complications - Diabetes: none.     currently breastfeeding. Maternal Exam:  Uterine Assessment: Contraction strength is mild.  Contraction frequency is regular.  Abdomen: Fetal presentation: vertex Cervix: Cervix evaluated by digital exam.     Fetal Exam Fetal Monitor Review: Mode: ultrasound.     Physical Exam Vitals and nursing note reviewed.  Constitutional:      Appearance: She is well-developed.  Cardiovascular:     Rate and Rhythm: Normal rate.     Heart sounds: Normal heart sounds.  Pulmonary:     Effort: Pulmonary effort is normal.     Breath sounds: Normal breath sounds.  Abdominal:      Palpations: Abdomen is soft.  Musculoskeletal:        General: Normal range of motion.     Cervical back: Normal range of motion.  Skin:    General: Skin is warm and dry.  Neurological:     Mental Status: She is alert and oriented to person, place, and time.  Psychiatric:        Behavior: Behavior normal.        Thought Content: Thought content normal.        Judgment: Judgment normal.     Prenatal labs: ABO, Rh: A/Positive/-- (08/17 0000) Antibody: Negative (08/17 0000) Rubella: Immune (08/17 0000) RPR: Nonreactive (08/17 0000)  HBsAg: Negative (08/17 0000)  HIV: Non-reactive (08/17 0000)  GBS: Positive/-- (01/16 0000)   Assessment/Plan: G2P1001 at 43w1din early/latent labor Pt unable to come on 09/01/22 when scheduled for IOL Admit to L&D   Discussed options with patient and reasons for recommended IOL at 41 weeks, including risk of stillbirth.  Pt believes she is in labor and desires to stay for admission but does not want medications/foley balloon to augment labor.  I discussed the option of nipple stimulation, with breast pump or done manually, and pt chooses to try nipple stimulation, walking, sitting on birth ball for a few hours to see if she can get into active labor.  I also discussed a next gentle step could be Cytotec PO plus continued walking/birth ball to see if regular labor pattern can be established. Pt to consider if needed.   GBS positive, PCN ordered but held on admission as pt declines IV fluids initially. Will start when contractions become stronger and closer together.  Undecided about contraception  LFatima Blank2/17/2024, 4:21 AM

## 2022-09-02 NOTE — Progress Notes (Signed)
LABOR PROGRESS NOTE  Meagan Vance is a 32 y.o. G2P1001 at [redacted]w[redacted]d admitted for IOL for postdates  Subjective: Uncomfortable through contractions.   Objective: BP (!) 101/50   Pulse 79   Temp 98.7 F (37.1 C) (Oral)   Resp 17   SpO2 100%  or  Vitals:   09/02/22 1453 09/02/22 1639 09/02/22 1727 09/02/22 1801  BP: (!) 102/57 (!) 105/51 (!) 101/50   Pulse: 79 79 79   Resp:      Temp: 98.4 F (36.9 C)   98.7 F (37.1 C)  TempSrc: Oral   Oral  SpO2:        Dilation: 6 Effacement (%): 90 Cervical Position: Middle Station: 0 Presentation: Vertex Exam by:: Dr. 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 G2P1001 at 453w1dere for IOL for post dates   Labor: 5-6/90/0. Good progression. Discussed risks and benefits of AROM and patient was agreeable. AROM with clear fluid. Will continue to monitor.  Fetal Wellbeing: Category 1, currently on intermittent monitoring Pain Control: Per patient request Anticipated MOD: Vaginal delivery  ViGifford ShaveMD  OB Fellow  09/02/2022, 6:04 PM

## 2022-09-02 NOTE — Anesthesia Procedure Notes (Signed)
Epidural Patient location during procedure: OB Start time: 09/02/2022 7:54 PM End time: 09/02/2022 8:05 PM  Staffing Anesthesiologist: Pervis Hocking, DO Performed: anesthesiologist   Preanesthetic Checklist Completed: patient identified, IV checked, risks and benefits discussed, monitors and equipment checked, pre-op evaluation and timeout performed  Epidural Patient position: sitting Prep: DuraPrep and site prepped and draped Patient monitoring: continuous pulse ox, blood pressure, heart rate and cardiac monitor Approach: midline Location: L3-L4 Injection technique: LOR air  Needle:  Needle type: Tuohy  Needle gauge: 17 G Needle length: 9 cm Needle insertion depth: 7 cm Catheter type: closed end flexible Catheter size: 19 Gauge Catheter at skin depth: 12 cm Test dose: negative  Assessment Sensory level: T8 Events: blood not aspirated, no cerebrospinal fluid, injection not painful, no injection resistance, no paresthesia and negative IV test  Additional Notes Patient identified. Risks/Benefits/Options discussed with patient including but not limited to bleeding, infection, nerve damage, paralysis, failed block, incomplete pain control, headache, blood pressure changes, nausea, vomiting, reactions to medication both or allergic, itching and postpartum back pain. Confirmed with bedside nurse the patient's most recent platelet count. Confirmed with patient that they are not currently taking any anticoagulation, have any bleeding history or any family history of bleeding disorders. Patient expressed understanding and wished to proceed. All questions were answered. Sterile technique was used throughout the entire procedure. Please see nursing notes for vital signs. Test dose was given through epidural catheter and negative prior to continuing to dose epidural or start infusion. Warning signs of high block given to the patient including shortness of breath, tingling/numbness in  hands, complete motor block, or any concerning symptoms with instructions to call for help. Patient was given instructions on fall risk and not to get out of bed. All questions and concerns addressed with instructions to call with any issues or inadequate analgesia.  Reason for block:procedure for pain

## 2022-09-02 NOTE — MAU Note (Signed)
..  Meagan Vance is a 32 y.o. at 18w1dhere in MAU reporting: contractions every 10 minutes since yesterday. Denies vaginal bleeding or leaking of fluid. +FM.  Pain score: 5/10  [id number] room 130's

## 2022-09-03 ENCOUNTER — Encounter (HOSPITAL_COMMUNITY): Payer: Self-pay | Admitting: Anesthesiology

## 2022-09-03 ENCOUNTER — Encounter (HOSPITAL_COMMUNITY): Payer: Self-pay | Admitting: Family Medicine

## 2022-09-03 DIAGNOSIS — O480 Post-term pregnancy: Secondary | ICD-10-CM | POA: Diagnosis not present

## 2022-09-03 DIAGNOSIS — Z3A41 41 weeks gestation of pregnancy: Secondary | ICD-10-CM | POA: Diagnosis not present

## 2022-09-03 DIAGNOSIS — O99820 Streptococcus B carrier state complicating pregnancy: Secondary | ICD-10-CM | POA: Diagnosis not present

## 2022-09-03 MED ORDER — WITCH HAZEL-GLYCERIN EX PADS: 1.0000 | MEDICATED_PAD | CUTANEOUS | Status: DC | PRN

## 2022-09-03 MED ORDER — TETANUS-DIPHTH-ACELL PERTUSSIS 5-2.5-18.5 LF-MCG/0.5 IM SUSY: 0.5000 mL | PREFILLED_SYRINGE | Freq: Once | INTRAMUSCULAR | Status: DC

## 2022-09-03 MED ORDER — SIMETHICONE 80 MG PO CHEW: 80.0000 mg | CHEWABLE_TABLET | ORAL | Status: DC | PRN

## 2022-09-03 MED ORDER — DIBUCAINE (PERIANAL) 1 % EX OINT: 1.0000 | TOPICAL_OINTMENT | CUTANEOUS | Status: DC | PRN

## 2022-09-03 MED ORDER — DIPHENHYDRAMINE HCL 25 MG PO CAPS: 25.0000 mg | ORAL_CAPSULE | Freq: Four times a day (QID) | ORAL | Status: DC | PRN

## 2022-09-03 MED ORDER — ONDANSETRON HCL 4 MG PO TABS: 4.0000 mg | ORAL_TABLET | ORAL | Status: DC | PRN

## 2022-09-03 MED ORDER — SENNOSIDES-DOCUSATE SODIUM 8.6-50 MG PO TABS
2.0000 | ORAL_TABLET | Freq: Every day | ORAL | Status: DC
  Administered 2022-09-04 – 2022-09-05 (×2): 2 via ORAL
  Filled 2022-09-03 (×2): qty 2

## 2022-09-03 MED ORDER — IBUPROFEN 600 MG PO TABS
600.0000 mg | ORAL_TABLET | Freq: Four times a day (QID) | ORAL | Status: DC
  Administered 2022-09-03 – 2022-09-05 (×8): 600 mg via ORAL
  Filled 2022-09-03 (×10): qty 1

## 2022-09-03 MED ORDER — ZOLPIDEM TARTRATE 5 MG PO TABS: 5.0000 mg | ORAL_TABLET | Freq: Every evening | ORAL | Status: DC | PRN

## 2022-09-03 MED ORDER — COCONUT OIL OIL: 1.0000 | TOPICAL_OIL | Status: DC | PRN

## 2022-09-03 MED ORDER — ACETAMINOPHEN 325 MG PO TABS: 650.0000 mg | ORAL_TABLET | ORAL | Status: DC | PRN

## 2022-09-03 MED ORDER — SODIUM CHLORIDE 0.9 % IV SOLN
5.0000 10*6.[IU] | Freq: Once | INTRAVENOUS | Status: DC
  Filled 2022-09-03: qty 5

## 2022-09-03 MED ORDER — PENICILLIN G POT IN DEXTROSE 60000 UNIT/ML IV SOLN: 3.0000 10*6.[IU] | INTRAVENOUS | Status: DC

## 2022-09-03 MED ORDER — BENZOCAINE-MENTHOL 20-0.5 % EX AERO: 1.0000 | INHALATION_SPRAY | CUTANEOUS | Status: DC | PRN

## 2022-09-03 MED ORDER — ONDANSETRON HCL 4 MG/2ML IJ SOLN: 4.0000 mg | INTRAMUSCULAR | Status: DC | PRN

## 2022-09-03 MED ORDER — PRENATAL MULTIVITAMIN CH
1.0000 | ORAL_TABLET | Freq: Every day | ORAL | Status: DC
  Administered 2022-09-04 – 2022-09-05 (×2): 1 via ORAL
  Filled 2022-09-03 (×3): qty 1

## 2022-09-03 NOTE — Lactation Note (Signed)
This note was copied from a baby's chart. Lactation Consultation Note  Patient Name: Meagan Vance [id number] Date: 09/03/2022  Reason for consult: Initial assessment;Mother's request;Term  Age:30 hours P2, 41.2 GA  Mother's primary language is [place] but denies need for an interpreter. Mother can communicate well in [place].   Mother has concerns that she is not making enough milk yet for her baby. Baby has breast fed and formula fed, at mother's request. Baby is sleeping in his crib. Mother wanted to try to latch baby. Infant latched well and suckled in brief burst. Reviewed basic breastfeeding education.   Mother states she breast fed her last child for 1 year and had a good supply. Instructed to observe baby for feeding cues, place baby skin to skin when parent is alert and awake and hand express prior to latch to entice baby to feed at breast.   Mother advised to feed baby small amounts of formula based on guidelines for supplementation. Call for assistance as needed.   Maternal Data Has patient been taught Hand Expression?: Yes Does the patient have breastfeeding experience prior to this delivery?: Yes How long did the patient breastfeed?: 1 year (child is now 4yo)  Feeding Mother's Current Feeding Choice: Breast Milk and Donor Milk Nipple Type: Extra Slow Flow  LATCH Score Latch: Grasps breast easily, tongue down, lips flanged, rhythmical sucking.  Audible Swallowing: None  Type of Nipple: Everted at rest and after stimulation  Comfort (Breast/Nipple): Soft / non-tender  Hold (Positioning): Assistance needed to correctly position infant at breast and maintain latch.  LATCH Score: 7  Interventions Interventions: Breast feeding basics reviewed;Assisted with latch;Skin to skin;Hand express;Adjust position;Support pillows;Education;LC Services brochure   Consult Status Consult Status: Follow-up Date: 09/04/22 Follow-up type: In-patient    Stana Bunting  M 09/03/2022, 5:12 PM

## 2022-09-03 NOTE — Progress Notes (Signed)
Labor Progress Note Meagan Vance is a 32 y.o. G2P1001 at 23w1dpresented for latent labor/IOL for postdates.   S: Sleeping.   O:  BP (!) 99/47   Pulse 95   Temp 98.2 F (36.8 C) (Oral)   Resp 18   SpO2 99%  EFM: 135bpm/moderate/+accels, no decels  CVE: Dilation: 8.5 Effacement (%): 100 Cervical Position: Middle Station: 0 Presentation: Vertex Exam by:: MLinton Rump RN   A&P: 32y.o. G2P1001 452w1dere for latent labor, IOL.  #Labor: Progressing well. Expectant management.  #Pain: Epidural #FWB: Cat I  #GBS positive, PCN ppx    [name], DO 6:48 AM

## 2022-09-03 NOTE — Discharge Summary (Signed)
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   Alpha thalassemia silent carrier  Additional problems: ***    Discharge diagnosis: {DX.:23714}                                              Post partum procedures:{Postpartum procedures:23558} Augmentation: {123456Complications: {OB Labor/Delivery Complications:20784}  Hospital course: {Courses:23701}  Magnesium Sulfate received: {Mag received:30440022} BMZ received: {BMZ received:30440023} Rhophylac:{Rhophylac received:30440032} MMR:{MMR:30440033} T-DaP:{Tdap:23962} Flu: {WG:1132360Transfusion:{Transfusion received:30440034}  Physical exam  Vitals:   09/03/22 0700 09/03/22 0731 09/03/22 0811 09/03/22 0816  BP: 99/60 (!) 121/92 117/65 (!) 130/93  Pulse: (!) 122 (!) 122 (!) 106 96  Resp:  '18 18 18  '$ Temp:   98.3 F (36.8 C)   TempSrc:   Oral   SpO2:       General: {Exam; general:21111117} Lochia: {Desc; appropriate/inappropriate:30686::"appropriate"} Uterine Fundus: {Desc; firm/soft:30687} Incision: {Exam; incision:21111123} DVT Evaluation: {Exam; dvt:2111122} Labs: Lab Results  Component Value Date   WBC 5.6 09/02/2022   HGB 11.0 (L) 09/02/2022   HCT 33.7 (L) 09/02/2022   MCV 87.8 09/02/2022   PLT 185 09/02/2022      Latest Ref Rng & Units 07/05/2020    2:30 PM  CMP  Glucose 65 - 99 mg/dL 117   BUN 6 - 20 mg/dL 3   Creatinine 0.57 - 1.00 mg/dL 0.52   Sodium 134 - 144 mmol/L 136    Potassium 3.5 - 5.2 mmol/L 3.8   Chloride 96 - 106 mmol/L 102   CO2 20 - 29 mmol/L 23   Calcium 8.7 - 10.2 mg/dL 9.3   Total Protein 6.0 - 8.5 g/dL 6.4   Total Bilirubin 0.0 - 1.2 mg/dL 0.2   Alkaline Phos 44 - 121 IU/L 91   AST 0 - 40 IU/L 16   ALT 0 - 32 IU/L 10    Edinburgh Score:    09/28/2020   10:34 AM  Edinburgh Postnatal Depression Scale Screening Tool  I have been able to laugh and see the funny side of things. 0  I have looked forward with enjoyment to things. 0  I have blamed myself unnecessarily when things went wrong. 0  I have been anxious or worried for no good reason. 0  I have felt scared or panicky for no good reason. 0  Things have been getting on top of me. 0  I have been so unhappy that I have had difficulty sleeping. 2  I have felt sad or miserable. 0  I have been so unhappy that I have been crying. 1  The thought of harming myself has occurred to me. 0  Edinburgh Postnatal Depression Scale Total 3     After visit meds:  Allergies as of 09/03/2022   No Known Allergies   [facility]        Discharge home in stable condition Infant Feeding: {Baby  feeding:23562} Infant Disposition:{CHL IP OB HOME WITH DX:[id number] Discharge instruction: per After Visit Summary and Postpartum booklet. Activity: Advance as tolerated. Pelvic rest for 6 weeks.  Diet: {OB BY:630183 Future Appointments:No future appointments. Follow up Visit:   Please schedule this patient for a {Visit type:23955} postpartum visit in {Postpartum visit:23953} with the following provider: {Provider type:23954}. Additional Postpartum F/U:{PP Procedure:23957}  {Risk Q000111Q pregnancy complicated by: 0000000 Delivery mode:  Vaginal, Spontaneous  Anticipated Birth Control:  {Birth Control:23956}   09/03/2022 Simone Autry-Lott, DO

## 2022-09-03 NOTE — Progress Notes (Signed)
Called to see patient for residual weakness on Right Lower Extremity.  Pt delivered on 09/03/2022, 0754 so epidural has been of for 7 hours and was re-dosed with 2% Lidocaine. Pt sensory is intact and she has 3/5 Right extension and flexion at the knee and the same at the hip.  Pt is able to move right leg bu not support.  She tells me that she has gained more movement in the last three hours. \ I reassured her and spoke with her nurse.

## 2022-09-04 LAB — CBC
HCT: 27.1 % — ABNORMAL LOW (ref 36.0–46.0)
Hemoglobin: 9.1 g/dL — ABNORMAL LOW (ref 12.0–15.0)
MCH: 29.3 pg (ref 26.0–34.0)
MCHC: 33.6 g/dL (ref 30.0–36.0)
MCV: 87.1 fL (ref 80.0–100.0)
Platelets: 188 10*3/uL (ref 150–400)
RBC: 3.11 MIL/uL — ABNORMAL LOW (ref 3.87–5.11)
RDW: 15.6 % — ABNORMAL HIGH (ref 11.5–15.5)
WBC: 14.7 10*3/uL — ABNORMAL HIGH (ref 4.0–10.5)
nRBC: 0 % (ref 0.0–0.2)

## 2022-09-04 NOTE — Progress Notes (Addendum)
POSTPARTUM PROGRESS NOTE  Subjective: Meagan Vance is a 32 y.o. [id number] s/p SVD at [redacted]w[redacted]d  She reports she doing well. No acute events overnight. She denies any problems with ambulating, voiding or po intake. Denies nausea or vomiting. She has passed flatus. Pain is well controlled.  Lochia is improving and appropriate.  Objective: Blood pressure (!) 86/55, pulse 80, temperature 97.8 F (36.6 C), temperature source Oral, resp. rate 16, SpO2 100 %, unknown if currently breastfeeding.  Physical Exam:  General: alert, cooperative and no distress Chest: no respiratory distress Abdomen: soft, non-tender  Uterine Fundus: firm and at level of umbilicus Extremities: No calf swelling or tenderness  no edema  Recent Labs    09/02/22 0458 09/04/22 0422  HGB 11.0* 9.1*  HCT 33.7* 27.1*    Assessment/Plan: Meagan Koelschis a 32y.o. [id number]/p SVD at 456w2dor postdates.  Routine Postpartum Care: Doing well, pain well-controlled.  -- Continue routine care, lactation support  -- Contraception: undecided -- Feeding: breast  Blood Pressure - Low this AM, but this seems chronic and pt's baseline.   Dispo: Plan for discharge tomorrow.  ZhArlyce DiceMDFredericksburgor WoCypress Surgery Centerealthcare 09/04/2022 7:16 AM

## 2022-09-04 NOTE — Social Work (Addendum)
MOB was referred for history of depression/anxiety.  * Referral screened out by Clinical Social Worker because none of the following criteria appear to apply:  ~ History of anxiety/depression during this pregnancy, or of post-partum depression following prior delivery. Per chart review no noted hx of anxiety. CSW consulted with MOB RN, no concerns noted.  ~ Diagnosis of anxiety and/or depression within last 3 years OR * MOB's symptoms currently being treated with medication and/or therapy.  CSW provided MOB with a pack n play, MOB reported she has a car seat for the infant.   Please contact the Clinical Social Worker if needs arise or by MOB request.   Letta Kocher, [place] Social Worker [phone]

## 2022-09-04 NOTE — Lactation Note (Signed)
This note was copied from a baby's chart. Lactation Consultation Note  Patient Name: Meagan Vance [id number] Date: 09/04/2022 Reason for consult: Follow-up assessment;Term (Infant with weight loss -.88%,  Per Birth Parent, infant is being breast and formula fed for each feeding.) Age:30 hours  Birth Parent wants speak English no interpreter needed at this time.   Per Birth Parent she feels breast feeding is going well, her choice she is supplementing infant after latching infant at the breast, infant BF for 10 minutes or longer most feeding, LC did not observe latch due infant recently BF for 10 minutes and given 40 mls of formula prior to [place] entering the room, infant appeared content in basinet. Birth Parent is experienced with Breastfeeding she BF and supplemented her 1st child for 1 year who is currently 78 years old. LC reviewed how to use hand pump and Birth Parent was easily expressing colostrum in pump flange. LC suggested she offer infant any pumped EBM first after latching infant at the breast, before supplementing with formula, Birth Parent knows her EBM is safe at room temp for 4 hours whereas formula must be used within 1 hour. Birth Parent doesn't have any questions or concerns for LC at this time. Birth Parent will continue to BF infant according to cues and supplement infant this is her feeding choice, 8 to 12 times within 24 hours.  Maternal Data    Feeding Mother's Current Feeding Choice: Breast Milk and Formula Nipple Type: Extra Slow Flow  LATCH Score                    Lactation Tools Discussed/Used    Interventions Interventions: Education;Pace feeding;Hand pump  Discharge    Consult Status Consult Status: Follow-up Date: 09/05/22 Follow-up type: In-patient    Eulis Canner 09/04/2022, 8:59 PM

## 2022-09-04 NOTE — Anesthesia Postprocedure Evaluation (Signed)
Anesthesia Post Note  Patient: Meagan Vance  Procedure(s) Performed: AN AD HOC LABOR EPIDURAL     Patient location during evaluation: Mother Baby Anesthesia Type: Epidural Level of consciousness: awake and alert Pain management: pain level controlled Vital Signs Assessment: post-procedure vital signs reviewed and stable Respiratory status: spontaneous breathing, nonlabored ventilation and respiratory function stable Cardiovascular status: stable Postop Assessment: no headache, no backache and epidural receding Anesthetic complications: no   No notable events documented.  Last Vitals:  Vitals:   09/03/22 2150 09/04/22 0506  BP: (!) 104/58 (!) 86/55  Pulse: 86 80  Resp: 17 16  Temp: 36.8 C 36.6 C  SpO2: 97% 100%    Last Pain:  Vitals:   09/04/22 0506  TempSrc: Oral  PainSc: 0-No pain   Pain Goal: Patients Stated Pain Goal: 0 (09/02/22 0422)                 Barkley Boards

## 2022-09-04 NOTE — Progress Notes (Signed)
Circumcision Consent  Discussed with mom at bedside about circumcision.   Circumcision is a surgery that removes the skin that covers the tip of the penis, called the "foreskin." Circumcision is usually done when a boy is between 46 and 35 days old, sometimes up to 6-59 weeks old.  The most common reasons boys are circumcised include for cultural/religious beliefs or for parental preference (potentially easier to clean, so baby looks like daddy, etc).  There may be some medical benefits for circumcision:   Circumcised boys seem to have slightly lower rates of: ? Urinary tract infections (per the American Academy of Pediatrics an uncircumcised boy has a 1/100 chance of developing a UTI in the first year of life, a circumcised boy at a 07/998 chance of developing a UTI in the first year of life- a 10% reduction) ? Penis cancer (typically rare- an uncircumcised female has a 1 in 100,000 chance of developing cancer of the penis) ? Sexually transmitted infection (in endemic areas, including HIV, HPV and Herpes- circumcision does NOT protect against gonorrhea, chlamydia, trachomatis, or syphilis) ? Phimosis: a condition where that makes retraction of the foreskin over the glans impossible (0.4 per 1000 boys per year or 0.6% of boys are affected by their 15th birthday)  Boys and men who are not circumcised can reduce these extra risks by: ? Cleaning their penis well ? Using condoms during sex  What are the risks of circumcision?  As with any surgical procedure, there are risks and complications. In circumcision, complications are rare and usually minor, the most common being: ? Bleeding- risk is reduced by holding each clamp for 30 seconds prior to a cut being made, and by holding pressure after the procedure is done ? Infection- the penis is cleaned prior to the procedure, and the procedure is done under sterile technique ? Damage to the urethra or amputation of the penis  How is circumcision done  in baby boys?  The baby will be placed on a special table and the legs restrained for their safety. Numbing medication is injected into the penis, and the skin is cleansed with betadine to decrease the risk of infection.   What to expect:  The penis will look red and raw for 5-7 days as it heals. We expect scabbing around where the cut was made, as well as clear-pink fluid and some swelling of the penis right after the procedure. If your baby's circumcision starts to bleed or develops pus, please contact your pediatrician immediately.  All questions were answered and mother consented.  Arlyce Dice, MD 9:28 AM

## 2022-09-04 NOTE — Progress Notes (Signed)
Post Partum Day 1 Subjective: no complaints, up ad lib, voiding, and tolerating PO  Objective: Blood pressure (!) 86/55, pulse 80, temperature 97.8 F (36.6 C), temperature source Oral, resp. rate 16, SpO2 100 %, unknown if currently breastfeeding.  Physical Exam:  General: alert, cooperative, and no distress Lochia: appropriate Uterine Fundus: firm Incision: n/a DVT Evaluation: No evidence of DVT seen on physical exam. Negative Homan's sign. No cords or calf tenderness. No significant calf/ankle edema.  Recent Labs    09/02/22 0458 09/04/22 0422  HGB 11.0* 9.1*  HCT 33.7* 27.1*    Assessment/Plan: Plan for discharge tomorrow, Breastfeeding, Lactation consult, and Circumcision prior to discharge   LOS: 2 days   Julianne Handler, CNM 09/04/2022, 1:52 PM

## 2022-09-05 MED ORDER — FERROUS SULFATE 325 (65 FE) MG PO TBEC: 325.0000 mg | DELAYED_RELEASE_TABLET | ORAL | 2 refills | Status: AC

## 2022-09-05 MED ORDER — POLYETHYLENE GLYCOL 3350 17 GM/SCOOP PO POWD: ORAL | 0 refills | Status: DC

## 2022-09-05 NOTE — Lactation Note (Signed)
This note was copied from a baby's chart. Lactation Consultation Note  Patient Name: Meagan Vance [id number] Date: 09/05/2022 Age : 30 hours  Reason for consult: Follow-up assessment;Term;Infant weight loss Per mom baby last fed at 8 am 35 ml of of formula.  LC reviewed supply and demand/ importance of baby breats feeding 1st and both breast and if satisfied hold off on the formula .  If giving formula keep it low if the baby has been to the breast 1st. LC reviewed BF D/C teaching and the [facility] resources.  Maternal Data    Feeding Mother's Current Feeding Choice: Breast Milk and Formula Nipple Type: Extra Slow Flow  Lactation Tools Discussed/Used  Hand pump   Interventions Interventions: Education;LC Services brochure  Discharge Discharge Education: Engorgement and breast care;Warning signs for feeding baby Pump: Manual;DEBP;Personal  Consult Status Consult Status: Complete Date: 09/05/22    Myer Haff 09/05/2022, 9:21 AM

## 2022-09-05 NOTE — Discharge Instructions (Signed)
-   Continue your prenatal vitamins especially if breastfeeding - Try to eat iron rich food. - Take iron supplement as prescribed every other day. Take along with fruit or orange juice, avoid taking within 30 mins of eating dairy (milk, cheese) - Take over the counter tylenol ('500mg'$ ) or ibuprofen ('200mg'$ ) three times a day as needed for cramping/pain. - follow up in clinic in 4-6 weeks as scheduled for your regular post partum visit. - Please come back to MAU if you notice persistently elevated blood pressures or you start to have a headache, that doesn't get better with medications (tylenol and ibuprofen), rest (4hrs of sleep) and drinking water.

## 2022-09-13 ENCOUNTER — Telehealth (HOSPITAL_COMMUNITY): Payer: Self-pay | Admitting: *Deleted

## 2022-09-13 NOTE — Telephone Encounter (Signed)
Hospital discharge follow-up call completed with Language Line interpreter, Richard [phone]. Patient voiced no questions or concerns regarding her health at this time. EPDS=8. Patient voiced no questions or concerns regarding infant at this time. Patient reports infant sleeps in a crib on his back. RN reviewed ABCs of safe sleep. Patient verbalized understanding. Erline Levine, RN, 09/13/22, [phone]

## 2022-10-11 ENCOUNTER — Ambulatory Visit: Payer: Self-pay | Admitting: Obstetrics and Gynecology

## 2022-10-11 NOTE — Progress Notes (Deleted)
    [place] Partum Visit Note  Meagan Vance is a 32 y.o. [id number] female who presents for a postpartum visit. She is {1-10:13787} {time; units:18646} postpartum following a normal spontaneous vaginal delivery.  I have fully reviewed the prenatal and intrapartum course. The delivery was at [redacted]w[redacted]d gestational weeks.  Anesthesia: {anesthesia types:812}. Postpartum course has been ***. Baby is doing well***. Baby is feeding by {breastmilk/bottle:69}. Bleeding {vag bleed:12292}. Bowel function is {normal:32111}. Bladder function is {normal:32111}. Patient {is/is not:9024} sexually active. Contraception method is {contraceptive method:5051}. Postpartum depression screening: {gen negative/positive:315881}.   The pregnancy intention screening data noted above was reviewed. Potential methods of contraception were discussed. The patient elected to proceed with No data recorded.    Health Maintenance Due  Topic Date Due   COVID-19 Vaccine (1) Never done   INFLUENZA VACCINE  Never done    {Common ambulatory SmartLinks:19316}  Review of Systems {ros; complete:30496}  Objective:  There were no vitals taken for this visit.   General:  {gen appearance:16600}   Breasts:  {desc; normal/abnormal/not indicated:14647}  Lungs: {lung exam:16931}  Heart:  {heart exam:5510}  Abdomen: {abdomen exam:16834}   Wound {Wound assessment:11097}  GU exam:  {desc; normal/abnormal/not indicated:14647}       Assessment:    There are no diagnoses linked to this encounter.  *** postpartum exam.   Plan:   Essential components of care per ACOG recommendations:  1.  Mood and well being: Patient with {gen negative/positive:315881} depression screening today. Reviewed local resources for support.  - Patient tobacco use? {tobacco use:25506}  - hx of drug use? {yes/no:25505}    2. Infant care and feeding:  -Patient currently breastmilk feeding? {yes/no:25502}  -Social determinants of health (SDOH) reviewed in  EPIC. No concerns***The following needs were identified***  3. Sexuality, contraception and birth spacing - Patient {DOES_DOES NF:2365131 want a pregnancy in the next year.  Desired family size is {NUMBER 1-10:22536} children.  - Reviewed reproductive life planning. Reviewed contraceptive methods based on pt preferences and effectiveness.  Patient desired {Upstream End Methods:24109} today.   - Discussed birth spacing of 18 months  4. Sleep and fatigue -Encouraged family/partner/community support of 4 hrs of uninterrupted sleep to help with mood and fatigue  5. Physical Recovery  - Discussed patients delivery and complications. She describes her labor as {description:25511} - Patient had a {CHL AMB [phone]}. Patient had a {laceration:25518} laceration. Perineal healing reviewed. Patient expressed understanding - Patient has urinary incontinence? {yes/no:25515} - Patient {ACTION; IS/IS VG:4697475 safe to resume physical and sexual activity  6.  Health Maintenance - HM due items addressed {Yes or If no, why not?:20788} - Last pap smear  Diagnosis  Date Value Ref Range Status  09/28/2020   Final   - Negative for intraepithelial lesion or malignancy (NILM)   Pap smear {done:10129} at today's visit.  -Breast Cancer screening indicated? {indicated:25516}  7. Chronic Disease/Pregnancy Condition follow up: {Follow up:25499}  - PCP follow up  Amado Coe, [place] for [place]

## 2022-10-13 ENCOUNTER — Telehealth (HOSPITAL_COMMUNITY): Payer: Self-pay

## 2022-10-13 NOTE — Telephone Encounter (Signed)
Chart review.

## 2022-10-16 ENCOUNTER — Ambulatory Visit: Payer: Medicaid Other | Admitting: Obstetrics & Gynecology

## 2022-10-21 ENCOUNTER — Other Ambulatory Visit: Payer: Self-pay | Admitting: Family Medicine

## 2022-11-09 ENCOUNTER — Ambulatory Visit: Payer: Medicaid Other | Admitting: Family Medicine

## 2022-11-23 ENCOUNTER — Other Ambulatory Visit (HOSPITAL_COMMUNITY)
Admission: RE | Admit: 2022-11-23 | Discharge: 2022-11-23 | Disposition: A | Discharge status: Home / Self Care | Payer: Medicaid Other | Source: Ambulatory Visit | Attending: Obstetrics and Gynecology | Admitting: Obstetrics and Gynecology

## 2022-11-23 ENCOUNTER — Encounter: Payer: Self-pay | Admitting: Family Medicine

## 2022-11-23 ENCOUNTER — Ambulatory Visit (INDEPENDENT_AMBULATORY_CARE_PROVIDER_SITE_OTHER): Payer: Medicaid Other | Admitting: Family Medicine

## 2022-11-23 ENCOUNTER — Other Ambulatory Visit: Payer: Self-pay

## 2022-11-23 VITALS — BP 102/69 | HR 80 | Wt 145.6 lb

## 2022-11-23 DIAGNOSIS — K5900 Constipation, unspecified: Secondary | ICD-10-CM | POA: Diagnosis not present

## 2022-11-23 DIAGNOSIS — Z01419 Encounter for gynecological examination (general) (routine) without abnormal findings: Secondary | ICD-10-CM | POA: Diagnosis not present

## 2022-11-23 DIAGNOSIS — Z3009 Encounter for other general counseling and advice on contraception: Secondary | ICD-10-CM

## 2022-11-23 DIAGNOSIS — Z113 Encounter for screening for infections with a predominantly sexual mode of transmission: Secondary | ICD-10-CM | POA: Diagnosis present

## 2022-11-23 MED ORDER — POLYETHYLENE GLYCOL 3350 17 GM/SCOOP PO POWD: ORAL | 11 refills | Status: AC

## 2022-11-23 MED ORDER — NORGESTIMATE-ETH ESTRADIOL 0.25-35 MG-MCG PO TABS: 1.0000 | ORAL_TABLET | Freq: Every day | ORAL | 11 refills | Status: AC

## 2022-11-23 NOTE — Assessment & Plan Note (Signed)
Cancer screening: - Pap: up to date - Mammogram: n/a - Colonoscopy: n/a  Contraception: Discussed today, would like to try OCP's. She does not have a history of clots, she does not smoke. Discussed continuing to use barrier method for 1-2 weeks, explained placebo pill week and ability to run packs together, reviewed may impact lactation.  Mood: PHQ-9 = 6    Metabolic: - DM: not indicated - Lipids: not indicated  ID: - STI: accepts screening today, swabs and serologies

## 2022-11-23 NOTE — Progress Notes (Signed)
[facility] GYN[facility]Y OFFICE VISIT NOTE  History:   Meagan Vance is a 32 y.o. [phone] here today for postpartum visit/well woman exam.  Now 11 weeks s/p uncomplicated vaginal delivery, no issues since then  Interested in birth control, has never been on it before Currently using condoms and has been breastfeeding since delivery (though not exclusively) and has not had a period  Endorses significant constipation   Health Maintenance Due  Topic Date Due   COVID-19 Vaccine (1) Never done    Past Medical History:  Diagnosis Date   Anemia    Fibroid     Past Surgical History:  Procedure Laterality Date   NO PAST SURGERIES      The following portions of the patient's history were reviewed and updated as appropriate: allergies, current medications, past family history, past medical history, past social history, past surgical history and problem list.   Health Maintenance:   Last pap: Lab Results  Component Value Date   DIAGPAP  09/28/2020    - Negative for intraepithelial lesion or malignancy (NILM)    Last mammogram:  N/a    Review of Systems:  Pertinent items noted in HPI and remainder of comprehensive ROS otherwise negative.  Physical Exam:  BP 102/69   Pulse 80   Wt 145 lb 9.6 oz (66 kg)   Breastfeeding Yes   BMI 28.44 kg/m  CONSTITUTIONAL: Well-developed, well-nourished female in no acute distress.  HEENT:  Normocephalic, atraumatic. External right and left ear normal. No scleral icterus.  NECK: Normal range of motion, supple, no masses noted on observation SKIN: No rash noted. Not diaphoretic. No erythema. No pallor. MUSCULOSKELETAL: Normal range of motion. No edema noted. NEUROLOGIC: Alert and oriented to person, place, and time. Normal muscle tone coordination.  PSYCHIATRIC: Normal mood and affect. Normal behavior. Normal judgment and thought content. RESPIRATORY: Effort normal, no problems with respiration noted   Labs and  Imaging No results found for this or any previous visit (from the past 168 hour(s)). No results found.    Assessment and Plan:   Problem List Items Addressed This Visit       Other   Well woman exam - Primary    Cancer screening: - Pap: up to date - Mammogram: n/a - Colonoscopy: n/a  Contraception: Discussed today, would like to try OCP's. She does not have a history of clots, she does not smoke. Discussed continuing to use barrier method for 1-2 weeks, explained placebo pill week and ability to run packs together, reviewed may impact lactation.  Mood: PHQ-9 = 6    Metabolic: - DM: not indicated - Lipids: not indicated  ID: - STI: accepts screening today, swabs and serologies      Other Visit Diagnoses     Postpartum exam       Screening examination for sexually transmitted disease       Relevant Orders   RPR+HBsAg+HCVAb+...   Cervicovaginal ancillary only( [place])   Constipation, unspecified constipation type       Relevant Medications   polyethylene glycol powder (MIRALAX) 17 GM/SCOOP powder   Encounter for counseling regarding contraception       Relevant Medications   norgestimate-ethinyl estradiol (ORTHO-CYCLEN) 0.25-35 MG-MCG tablet       Routine preventative health maintenance measures emphasized. Please refer to After Visit Summary for other counseling recommendations.   Return in about 1 year (around 11/23/2023) for Annual Wellness Visit.    Total face-to-face time with patient: 20 minutes.  Over 50% of encounter was spent on counseling and coordination of care.   Clarnce Flock, MD/MPH Attending Family Medicine Physician, [facility] for [facility], [place]

## 2022-11-24 LAB — RPR+HBSAG+HCVAB+...
HIV Screen 4th Generation wRfx: NONREACTIVE
Hep C Virus Ab: NONREACTIVE
Hepatitis B Surface Ag: NEGATIVE
RPR Ser Ql: NONREACTIVE

## 2022-11-27 LAB — CERVICOVAGINAL ANCILLARY ONLY
Chlamydia: NEGATIVE
Comment: NEGATIVE
Comment: NEGATIVE
Comment: NORMAL
Neisseria Gonorrhea: NEGATIVE
Trichomonas: NEGATIVE

## 2023-08-31 ENCOUNTER — Ambulatory Visit (HOSPITAL_COMMUNITY)
Admission: EM | Admit: 2023-08-31 | Discharge: 2023-08-31 | Disposition: A | Discharge status: Home / Self Care | Payer: Medicaid Other | Attending: Family Medicine | Admitting: Family Medicine

## 2023-08-31 ENCOUNTER — Encounter (HOSPITAL_COMMUNITY): Payer: Self-pay

## 2023-08-31 DIAGNOSIS — N898 Other specified noninflammatory disorders of vagina: Secondary | ICD-10-CM | POA: Diagnosis present

## 2023-08-31 DIAGNOSIS — R1011 Right upper quadrant pain: Secondary | ICD-10-CM | POA: Diagnosis present

## 2023-08-31 NOTE — ED Triage Notes (Signed)
Per Interpreter-Tianni #[id number]  Patient states, "I think I have intestinal worms. Patient c/o abdominal pain x 1 month." Patient also reports that she "feels things moving around in her abdomen."  Patient states, "I have vaginal itching" x 1 week.

## 2023-08-31 NOTE — Discharge Instructions (Addendum)
Staff will notify you if there is anything positive on the swab. [facility] personnel vous informera s'il y a quelque chose de [employer].)  Please collect a stool specimen at home and bring it back to this clinic.  We will send it to the hospital for testing.  There is anything positive on the test our staff will call you. Steele Sizer prlever un chantillon de selles  la maison et le rapporter  [place].  Nous l'enverrons  l'hpital pour des tests.  S'il y a quelque chose de [employer] test, notre personnel vous appellera.)   Please also follow-up with your primary care about this issue. Steele Sizer galement faire un suivi auprs de votre mdecin traitant  propos de ce problme.)

## 2023-08-31 NOTE — ED Notes (Signed)
Materials needed to collect a stool specimen at home given to the patient. Patient voiced understand after collecting that she needs to bring back to the UC.

## 2023-08-31 NOTE — ED Provider Notes (Signed)
[facility]    CSN: [id number] Arrival date & time: 08/31/23  0900      History   Chief Complaint Chief Complaint  Patient presents with   Abdominal Pain   Vaginal Itching    HPI Meagan Vance is a 33 y.o. female.    Abdominal Pain Vaginal Itching Associated symptoms include abdominal pain.  Here for some right upper quadrant pain that mainly bothers her at night, some sensation that something is moving around her bellybutton inside her abdomen, and some vaginal itching.  For about 1 month she has had the sensation that something is moving around in her abdomen.  She has had some intermittent right upper quadrant abdominal pain, and it mainly bothers her at night.  It is not necessarily associated with eating.  She has had no fever or chills or nausea or vomiting or diarrhea.  Bowel movements have been normal and no blood in the stool.  Appetite has been good.  About 1 week ago she began having vaginal itching.  No discharge.  No dysuria  NKDA  LMP is 08/16/2023.      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 Authorizing Provider  acetaminophen (TYLENOL) 325 MG tablet Take 2 tablets (650 mg total) by mouth every 6 (six) hours. Patient not taking: Reported on 11/23/2022 08/15/20   Gita Kudo, MD  ferrous sulfate 325 (65 FE) MG EC tablet Take 1  tablet (325 mg total) by mouth every other day. Patient not taking: Reported on 11/23/2022 09/05/22 03/04/23  Ndulue, Nadene Rubins, MD  norgestimate-ethinyl estradiol (ORTHO-CYCLEN) 0.25-35 MG-MCG tablet Take 1 tablet by mouth daily. 11/23/22   Venora Maples, MD  polyethylene glycol powder [facility]) 17 GM/SCOOP powder Take 1 capful in 8oz of water or juice every day as needed for constipation. 11/23/22   Venora Maples, MD  Prenatal Vit-Fe Fumarate-FA (PREPLUS) 27-1 MG TABS Take 1 tablet by mouth daily. 05/25/20   Gita Kudo, MD    Family History Family History  Problem Relation Age of Onset   Other Mother        ?maybe sick, unknown cause of death   Hypertension Maternal Uncle     Social History Social History   Tobacco Use   Smoking status: Never   Smokeless tobacco: Never  Vaping Use   Vaping status: Never Used  Substance Use Topics   Alcohol use: Not Currently    Comment: a little   Drug use: Never     Allergies   Patient has no  known allergies.   Review of Systems Review of Systems  Gastrointestinal:  Positive for abdominal pain.     Physical Exam Triage Vital Signs ED Triage Vitals  Encounter Vitals Group     BP 08/31/23 0944 102/68     Systolic BP Percentile --      Diastolic BP Percentile --      Pulse Rate 08/31/23 0944 (!) 59     Resp 08/31/23 0944 14     Temp 08/31/23 0944 98.4 F (36.9 C)     Temp Source 08/31/23 0944 Oral     SpO2 08/31/23 0944 98 %     Weight --      Height --      Head Circumference --      Peak Flow --      Pain Score 08/31/23 0949 0     Pain Loc --      Pain Education --      Exclude from Growth Chart --    No data found.  Updated Vital Signs BP 102/68 (BP Location: Left Arm)   Pulse (!) 59   Temp 98.4 F (36.9 C) (Oral)   Resp 14   LMP 08/16/2023 (Approximate)   SpO2 98%   Visual Acuity Right Eye Distance:   Left Eye Distance:   Bilateral Distance:    Right Eye Near:   Left Eye Near:    Bilateral  Near:     Physical Exam Vitals reviewed.  Constitutional:      General: She is not in acute distress.    Appearance: She is not ill-appearing, toxic-appearing or diaphoretic.  HENT:     Mouth/Throat:     Mouth: Mucous membranes are moist.  Eyes:     Extraocular Movements: Extraocular movements intact.     Conjunctiva/sclera: Conjunctivae normal.     Pupils: Pupils are equal, round, and reactive to light.  Cardiovascular:     Rate and Rhythm: Normal rate and regular rhythm.     Heart sounds: No murmur heard. Pulmonary:     Breath sounds: Normal breath sounds.  Abdominal:     General: Bowel sounds are normal. There is no distension.     Palpations: Abdomen is soft. There is no mass.     Tenderness: There is no abdominal tenderness. There is no guarding.  Musculoskeletal:     Cervical back: Neck supple.  Lymphadenopathy:     Cervical: No cervical adenopathy.  Skin:    Capillary Refill: Capillary refill takes less than 2 seconds.     Coloration: Skin is not jaundiced or pale.  Neurological:     General: No focal deficit present.     Mental Status: She is alert and oriented to person, place, and time.  Psychiatric:        Behavior: Behavior normal.      UC Treatments / Results  Labs (all labs ordered are listed, but only abnormal results are displayed) Labs Reviewed  CERVICOVAGINAL ANCILLARY ONLY    EKG   Radiology No results found.  Procedures Procedures (including critical care time)  Medications Ordered in UC Medications - No data to display  Initial Impression / Assessment and Plan / UC Course  I have reviewed the triage vital signs and the nursing notes.  Pertinent labs & imaging results that were available during my care of the patient were reviewed by me and considered in my medical decision making (see chart for details).      Visit is conducted in  English, per the patient's request. Stool specimen is ordered for O&P.  Vaginal self swab is done,  and we will notify of any positives on that and treat per protocol.  I have asked her to follow-up with her primary care about this issue Final Clinical Impressions(s) / UC Diagnoses   Final diagnoses:  Right upper quadrant abdominal pain  Vaginal itching     Discharge Instructions      Staff will notify you if there is anything positive on the swab. [facility] personnel vous informera s'il y a quelque chose de [employer].)  Please collect a stool specimen at home and bring it back to this clinic.  We will send it to the hospital for testing.  There is anything positive on the test our staff will call you. Steele Sizer prlever un chantillon de selles  la maison et le rapporter  [place].  Nous l'enverrons  l'hpital pour des tests.  S'il y a quelque chose de [employer] test, notre personnel vous appellera.)   Please also follow-up with your primary care about this issue. Steele Sizer galement faire un suivi auprs de votre mdecin traitant  propos de ce problme.)      ED Prescriptions   None    PDMP not reviewed this encounter.   Zenia Resides, MD 08/31/23 1025

## 2023-09-02 ENCOUNTER — Telehealth (HOSPITAL_COMMUNITY): Payer: Self-pay | Admitting: Emergency Medicine

## 2023-09-02 NOTE — Telephone Encounter (Signed)
Original O&P order was cancelled - this encounter is solely to place new order for the lab. See provider note for full visit and MDM

## 2023-09-03 ENCOUNTER — Other Ambulatory Visit (HOSPITAL_COMMUNITY)
Admission: RE | Admit: 2023-09-03 | Discharge: 2023-09-03 | Disposition: A | Discharge status: Home / Self Care | Payer: MEDICAID | Source: Ambulatory Visit | Attending: Physician Assistant | Admitting: Physician Assistant

## 2023-09-03 DIAGNOSIS — R1011 Right upper quadrant pain: Secondary | ICD-10-CM | POA: Insufficient documentation

## 2023-09-03 LAB — CERVICOVAGINAL ANCILLARY ONLY
Bacterial Vaginitis (gardnerella): NEGATIVE
Candida Glabrata: NEGATIVE
Candida Vaginitis: NEGATIVE
Chlamydia: NEGATIVE
Comment: NEGATIVE
Comment: NEGATIVE
Comment: NEGATIVE
Comment: NEGATIVE
Comment: NEGATIVE
Comment: NORMAL
Neisseria Gonorrhea: NEGATIVE
Trichomonas: NEGATIVE

## 2023-09-06 LAB — O&P RESULT

## 2023-09-06 LAB — OVA + PARASITE EXAM
